# Patient Record
Sex: Female | Born: 1935 | Race: White | Hispanic: No | Marital: Single | Smoking: Never smoker
Health system: Southern US, Community
[De-identification: ages and names within clinical notes are randomized; demographics above are authoritative.]

## PROBLEM LIST (undated history)

## (undated) DIAGNOSIS — F419 Anxiety disorder, unspecified: Secondary | ICD-10-CM

## (undated) DIAGNOSIS — N281 Cyst of kidney, acquired: Secondary | ICD-10-CM

## (undated) DIAGNOSIS — D649 Anemia, unspecified: Secondary | ICD-10-CM

## (undated) DIAGNOSIS — I639 Cerebral infarction, unspecified: Secondary | ICD-10-CM

## (undated) DIAGNOSIS — I10 Essential (primary) hypertension: Secondary | ICD-10-CM

## (undated) DIAGNOSIS — G20 Parkinson's disease: Secondary | ICD-10-CM

## (undated) DIAGNOSIS — R251 Tremor, unspecified: Secondary | ICD-10-CM

## (undated) DIAGNOSIS — R32 Unspecified urinary incontinence: Secondary | ICD-10-CM

## (undated) DIAGNOSIS — N3010 Interstitial cystitis (chronic) without hematuria: Secondary | ICD-10-CM

## (undated) DIAGNOSIS — K219 Gastro-esophageal reflux disease without esophagitis: Secondary | ICD-10-CM

## (undated) DIAGNOSIS — G20A1 Parkinson's disease without dyskinesia, without mention of fluctuations: Secondary | ICD-10-CM

## (undated) DIAGNOSIS — F329 Major depressive disorder, single episode, unspecified: Secondary | ICD-10-CM

## (undated) DIAGNOSIS — M869 Osteomyelitis, unspecified: Secondary | ICD-10-CM

## (undated) DIAGNOSIS — M1990 Unspecified osteoarthritis, unspecified site: Secondary | ICD-10-CM

## (undated) DIAGNOSIS — E538 Deficiency of other specified B group vitamins: Secondary | ICD-10-CM

## (undated) DIAGNOSIS — F32A Depression, unspecified: Secondary | ICD-10-CM

## (undated) HISTORY — DX: Parkinson's disease without dyskinesia, without mention of fluctuations: G20.A1

## (undated) HISTORY — DX: Tremor, unspecified: R25.1

## (undated) HISTORY — DX: Major depressive disorder, single episode, unspecified: F32.9

## (undated) HISTORY — DX: Unspecified urinary incontinence: R32

## (undated) HISTORY — DX: Anemia, unspecified: D64.9

## (undated) HISTORY — PX: TOTAL KNEE ARTHROPLASTY: SHX125

## (undated) HISTORY — DX: Deficiency of other specified B group vitamins: E53.8

## (undated) HISTORY — PX: TOTAL VAGINAL HYSTERECTOMY: SHX2548

## (undated) HISTORY — DX: Interstitial cystitis (chronic) without hematuria: N30.10

## (undated) HISTORY — DX: Anxiety disorder, unspecified: F41.9

## (undated) HISTORY — DX: Cyst of kidney, acquired: N28.1

## (undated) HISTORY — DX: Parkinson's disease: G20

## (undated) HISTORY — DX: Essential (primary) hypertension: I10

## (undated) HISTORY — DX: Osteomyelitis, unspecified: M86.9

## (undated) HISTORY — DX: Unspecified osteoarthritis, unspecified site: M19.90

## (undated) HISTORY — PX: LEG SURGERY: SHX1003

## (undated) HISTORY — DX: Cerebral infarction, unspecified: I63.9

## (undated) HISTORY — DX: Depression, unspecified: F32.A

## (undated) HISTORY — DX: Gastro-esophageal reflux disease without esophagitis: K21.9

## (undated) HISTORY — PX: TOTAL HIP ARTHROPLASTY: SHX124

---

## 2002-09-09 ENCOUNTER — Ambulatory Visit (HOSPITAL_COMMUNITY): Admission: RE | Admit: 2002-09-09 | Discharge: 2002-09-09 | Payer: Self-pay | Admitting: Gastroenterology

## 2004-07-30 ENCOUNTER — Emergency Department (HOSPITAL_COMMUNITY): Admission: EM | Admit: 2004-07-30 | Discharge: 2004-07-30 | Payer: Self-pay | Admitting: Emergency Medicine

## 2005-04-12 ENCOUNTER — Encounter: Admission: RE | Admit: 2005-04-12 | Discharge: 2005-04-12 | Payer: Self-pay | Admitting: Orthopedic Surgery

## 2005-05-16 ENCOUNTER — Encounter: Admission: RE | Admit: 2005-05-16 | Discharge: 2005-05-16 | Payer: Self-pay | Admitting: Orthopedic Surgery

## 2005-10-11 ENCOUNTER — Encounter: Admission: RE | Admit: 2005-10-11 | Discharge: 2005-10-11 | Payer: Self-pay | Admitting: Family Medicine

## 2008-01-02 ENCOUNTER — Encounter: Admission: RE | Admit: 2008-01-02 | Discharge: 2008-01-02 | Payer: Self-pay | Admitting: Gastroenterology

## 2008-04-09 ENCOUNTER — Encounter: Admission: RE | Admit: 2008-04-09 | Discharge: 2008-04-09 | Payer: Self-pay | Admitting: Family Medicine

## 2008-05-09 DIAGNOSIS — G20A1 Parkinson's disease without dyskinesia, without mention of fluctuations: Secondary | ICD-10-CM | POA: Insufficient documentation

## 2009-09-22 ENCOUNTER — Ambulatory Visit (HOSPITAL_COMMUNITY): Admission: RE | Admit: 2009-09-22 | Discharge: 2009-09-22 | Payer: Self-pay | Admitting: Orthopedic Surgery

## 2010-03-05 ENCOUNTER — Encounter: Payer: Self-pay | Admitting: Orthopedic Surgery

## 2010-03-05 ENCOUNTER — Encounter: Payer: Self-pay | Admitting: Diagnostic Neuroimaging

## 2010-03-21 ENCOUNTER — Other Ambulatory Visit: Payer: Self-pay | Admitting: Diagnostic Neuroimaging

## 2010-03-21 DIAGNOSIS — R259 Unspecified abnormal involuntary movements: Secondary | ICD-10-CM

## 2010-03-21 DIAGNOSIS — G20 Parkinson's disease: Secondary | ICD-10-CM

## 2010-03-21 DIAGNOSIS — R269 Unspecified abnormalities of gait and mobility: Secondary | ICD-10-CM

## 2010-03-24 ENCOUNTER — Ambulatory Visit
Admission: RE | Admit: 2010-03-24 | Discharge: 2010-03-24 | Disposition: A | Discharge status: Home / Self Care | Payer: MEDICARE | Source: Ambulatory Visit | Attending: Diagnostic Neuroimaging | Admitting: Diagnostic Neuroimaging

## 2010-03-24 DIAGNOSIS — G20 Parkinson's disease: Secondary | ICD-10-CM

## 2010-03-24 DIAGNOSIS — R259 Unspecified abnormal involuntary movements: Secondary | ICD-10-CM

## 2010-03-24 DIAGNOSIS — R269 Unspecified abnormalities of gait and mobility: Secondary | ICD-10-CM

## 2010-04-13 ENCOUNTER — Ambulatory Visit: Payer: MEDICARE | Attending: Diagnostic Neuroimaging | Admitting: Physical Therapy

## 2010-04-13 DIAGNOSIS — R269 Unspecified abnormalities of gait and mobility: Secondary | ICD-10-CM | POA: Insufficient documentation

## 2010-04-13 DIAGNOSIS — IMO0001 Reserved for inherently not codable concepts without codable children: Secondary | ICD-10-CM | POA: Insufficient documentation

## 2010-04-13 DIAGNOSIS — G20A1 Parkinson's disease without dyskinesia, without mention of fluctuations: Secondary | ICD-10-CM | POA: Insufficient documentation

## 2010-04-13 DIAGNOSIS — G20 Parkinson's disease: Secondary | ICD-10-CM | POA: Insufficient documentation

## 2010-04-17 ENCOUNTER — Ambulatory Visit: Payer: MEDICARE | Admitting: Physical Therapy

## 2010-04-20 ENCOUNTER — Ambulatory Visit: Payer: MEDICARE | Admitting: Physical Therapy

## 2010-04-25 ENCOUNTER — Ambulatory Visit: Payer: MEDICARE | Admitting: Physical Therapy

## 2010-04-27 ENCOUNTER — Ambulatory Visit: Payer: MEDICARE | Admitting: Physical Therapy

## 2010-05-10 ENCOUNTER — Ambulatory Visit: Payer: MEDICARE | Admitting: Physical Therapy

## 2010-05-12 ENCOUNTER — Ambulatory Visit: Payer: MEDICARE | Admitting: Physical Therapy

## 2010-05-15 ENCOUNTER — Encounter: Payer: MEDICARE | Admitting: Occupational Therapy

## 2010-05-16 ENCOUNTER — Ambulatory Visit: Payer: Medicare Other | Attending: Diagnostic Neuroimaging | Admitting: Physical Therapy

## 2010-05-16 DIAGNOSIS — G20 Parkinson's disease: Secondary | ICD-10-CM | POA: Insufficient documentation

## 2010-05-16 DIAGNOSIS — IMO0001 Reserved for inherently not codable concepts without codable children: Secondary | ICD-10-CM | POA: Insufficient documentation

## 2010-05-16 DIAGNOSIS — R269 Unspecified abnormalities of gait and mobility: Secondary | ICD-10-CM | POA: Insufficient documentation

## 2010-05-16 DIAGNOSIS — G20A1 Parkinson's disease without dyskinesia, without mention of fluctuations: Secondary | ICD-10-CM | POA: Insufficient documentation

## 2010-05-18 ENCOUNTER — Encounter: Payer: MEDICARE | Admitting: Occupational Therapy

## 2010-05-24 ENCOUNTER — Ambulatory Visit: Payer: Medicare Other | Admitting: Occupational Therapy

## 2010-06-30 NOTE — Op Note (Signed)
   NAMELACONYA, CLERE                        ACCOUNT NO.:  [id number]   MEDICAL RECORD NO.:  [id number]                   PATIENT TYPE:  AMB   LOCATION:  ENDO                                 FACILITY:  [facility]   PHYSICIAN:  Petra Kuba, M.D.                 DATE OF BIRTH:  Mar 20, 1929   DATE OF PROCEDURE:  09/09/2002  DATE OF DISCHARGE:                                 OPERATIVE REPORT   PROCEDURE PERFORMED:  Colonoscopy.   ENDOSCOPIST:  Petra Kuba, M.D.   INDICATIONS FOR PROCEDURE:  Screening.  Consent was signed after the risks,  benefits, methods and options were thoroughly discussed in the office and  before any premeds given.   MEDICINES USED:  Demerol 70 mg, Versed 7 mg.   DESCRIPTION OF PROCEDURE:  Rectal inspection was pertinent for external  hemorrhoids, small.  Digital exam was negative.  A video pediatric  adjustable colonoscope was inserted and with mild abdominal pressure  advanced around the colon to the cecum.  This did not require any position  changes.  There was no obvious abnormality.  Left-sided diverticula were  seen on insertion.  The cecum was identified by the appendiceal orifice and  the ileocecal valve.  The prep was fairly adequate.  With washing and  suctioning, adequate visualization was obtained, the scope was slowly  withdrawn.  Other than the left-sided diverticula scattered as mentioned  above, no abnormalities were seen.  Specifically no polyps, tumors, masses.  Anorectal pullthrough and retroflexion confirmed some small hemorrhoids in  the rectum.  The scope was reinserted a short ways up the left side of the  colon.  Air was suctioned, scope removed.  The patient tolerated the  procedure well.  There was no immediate obvious complication.   ENDOSCOPIC DIAGNOSIS:  1. Internal and external hemorrhoids, small.  2. Scattered diverticula.  3. Otherwise within normal limits to the cecum.   PLAN:  Happy to see back p.r.n.  Otherwise return  care to Dr. Lenise Arena for  customary health care maintenance to include yearly rectals and guaiacs and  consider repeat screening in 5 to 10 years if doing well medically.                                               Petra Kuba, M.D.    MEM/MEDQ  D:  09/09/2002  T:  09/09/2002  Job:  [id number]   cc:   Joycelyn Rua, M.D.  [address] [place]  [place] [address]  Fax: [phone]

## 2011-04-19 ENCOUNTER — Other Ambulatory Visit (HOSPITAL_COMMUNITY)
Admission: RE | Admit: 2011-04-19 | Discharge: 2011-04-19 | Disposition: A | Discharge status: Home / Self Care | Payer: Medicare Other | Source: Ambulatory Visit | Attending: Obstetrics and Gynecology | Admitting: Obstetrics and Gynecology

## 2011-04-19 DIAGNOSIS — Z124 Encounter for screening for malignant neoplasm of cervix: Secondary | ICD-10-CM | POA: Insufficient documentation

## 2011-10-19 ENCOUNTER — Telehealth: Payer: Self-pay | Admitting: Oncology

## 2011-10-19 NOTE — Telephone Encounter (Signed)
pt l/m that she would like an appt re: a knot on her hand,l/m for Dorene Grebe to call the pt  aom

## 2012-07-08 ENCOUNTER — Ambulatory Visit: Payer: Self-pay | Admitting: Diagnostic Neuroimaging

## 2012-07-09 ENCOUNTER — Ambulatory Visit (INDEPENDENT_AMBULATORY_CARE_PROVIDER_SITE_OTHER): Payer: Medicare Other | Admitting: Diagnostic Neuroimaging

## 2012-07-09 ENCOUNTER — Encounter: Payer: Self-pay | Admitting: Diagnostic Neuroimaging

## 2012-07-09 VITALS — BP 128/79 | HR 85 | Temp 98.4°F | Ht 64.0 in | Wt 139.0 lb

## 2012-07-09 DIAGNOSIS — R259 Unspecified abnormal involuntary movements: Secondary | ICD-10-CM

## 2012-07-09 DIAGNOSIS — R251 Tremor, unspecified: Secondary | ICD-10-CM

## 2012-07-09 DIAGNOSIS — R269 Unspecified abnormalities of gait and mobility: Secondary | ICD-10-CM | POA: Insufficient documentation

## 2012-07-09 DIAGNOSIS — G20 Parkinson's disease: Secondary | ICD-10-CM

## 2012-07-09 DIAGNOSIS — G20A1 Parkinson's disease without dyskinesia, without mention of fluctuations: Secondary | ICD-10-CM

## 2012-07-09 MED ORDER — CARBIDOPA-LEVODOPA 25-100 MG PO TABS: 1.0000 | ORAL_TABLET | Freq: Three times a day (TID) | ORAL | Status: DC

## 2012-07-09 NOTE — Progress Notes (Signed)
[facility]  PATIENT: Nina Wade DOB: March 10, 1929  REFERRING CLINICIAN:  HISTORY FROM: PATIENT REASON FOR VISIT: routine follow up   HISTORICAL  CHIEF COMPLAINT:  Chief Complaint  Patient presents with  . Follow-up    PD    HISTORY OF PRESENT ILLNESS:   UPDATE 07/09/12: 79 year old left-handed female, has been 6 months since last visit.  She states that she feels like she is doing the same.  No falls since her last visit.  She is taking the carb/levo three times a day at 8, 12, and in the evening, but tells me she doesn't feel any different when she takes it or can't tell that it makes a difference.  She is experiencing constipation, and takes Miralax prn and estimates her BMs to be every other day.  Her main complaint is fatigue.  She says she is walking ok without the cane or walker.  She does not think her tremors are any worse.  UPDATE 11/19/11: Patient forgot to follow up. Has been 1.5 years since last visit/ [place], she tells me that she has been taking only 1 tab of carb/levo per day (says this was written on the bottle). Many more falls lately. Using a 4 point cane; got a rolling walker with brakes last friday. Has tried PT, but now discharged.  UPDATE 04/19/10: Doing about the same.  Think her tremor slightly improved since starting carb/levo.  Has been to PT and doing well.  No falls.  No side effects.    PRIOR HPI: 79 year old left-handed female with history of hypertension and depression presenting for evaluation of tremor since March 2010.  Patient reports progressive right upper extremity tremor.  She was initially evaluated [facility] by Dr. Conrad Vincennes, who raised the possibility of Parkinson's disease but did not start treatment since her tremor was mild and improving.  Now tremor is worse.  No tremor in her left hand. She denies smell or taste problems, vivid dreams, but does have balance and walking problems.  She recently fell down when power  went out and she tripped in the dark.  No family history of Parkinson's disease. The patient thinks that her symptoms started after her multiple orthopedic surgeries which were complicated by infection.  REVIEW OF SYSTEMS: Full 14 system review of systems performed and notable only for weight loss fatigue incontinence urination from joint pain decreased energy.  ALLERGIES: No Known Allergies  HOME MEDICATIONS: Outpatient Encounter Prescriptions as of 07/09/2012  Medication Sig Dispense Refill  . aspirin 325 MG tablet Take 325 mg by mouth daily.      . carbidopa-levodopa (SINEMET IR) 25-100 MG per tablet Take 1 tablet by mouth 3 (three) times daily. Take 30 minutes before meals.  90 tablet  12  . ciprofloxacin (CIPRO) 500 MG tablet Take 500 mg by mouth 2 (two) times daily.      Marland Kitchen co-enzyme Q-10 50 MG capsule Take 50 mg by mouth daily.      Marland Kitchen FLUoxetine (PROZAC) 20 MG capsule Take 20 mg by mouth 2 (two) times daily.      . Multiple Vitamins-Minerals (MULTIVITAMIN WITH MINERALS) tablet Take 1 tablet by mouth daily.      Marland Kitchen oxybutynin (DITROPAN-XL) 5 MG 24 hr tablet Take 5 mg by mouth daily.      . polyethylene glycol powder (GLYCOLAX/MIRALAX) powder Take 17 g by mouth daily as needed.      . valsartan-hydrochlorothiazide (DIOVAN-HCT) 160-12.5 MG per tablet Take 1 tablet by mouth  daily.      . vitamin B-12 (CYANOCOBALAMIN) 100 MCG tablet Take 50 mcg by mouth daily.      . [DISCONTINUED] carbidopa-levodopa (SINEMET IR) 25-100 MG per tablet Take 1 tablet by mouth 3 (three) times daily.        No facility-administered encounter medications on file as of 07/09/2012.    PAST MEDICAL HISTORY: Past Medical History  Diagnosis Date  . Arthritis   . Hypertension   . Anxiety   . Interstitial cystitis   . B12 deficiency   . Tremor   . CVA (cerebral infarction)   . Osteomyelitis   . Renal cyst   . Incontinence   . Esophageal reflux   . Anemia   . Depressive disorder   . Parkinson disease      PAST SURGICAL HISTORY: Past Surgical History  Procedure Laterality Date  . Total hip arthroplasty    . Total knee arthroplasty    . Leg surgery Right     x6  . Total vaginal hysterectomy      FAMILY HISTORY: Family History  Problem Relation Age of Onset  . Cancer Father   . Heart Problems Mother   . Cancer Brother     SOCIAL HISTORY:  History   Social History  . Marital Status: Single    Spouse Name: N/A    Number of Children: 3  . Years of Education: HS   Occupational History  . Retired    Social History Main Topics  . Smoking status: Never Smoker   . Smokeless tobacco: Never Used  . Alcohol Use: No  . Drug Use: No  . Sexually Active: Not on file   Other Topics Concern  . Not on file   Social History Narrative   Pt lives at home with her spouse.   She has been married for 58+ years.   Caffeine Use: Does not consume     PHYSICAL EXAM  Filed Vitals:   07/09/12 1015  BP: 128/79  Pulse: 85  Temp: 98.4 F (36.9 C)  TempSrc: Oral  Height: 5\' 4"  (1.626 m)  Weight: 139 lb (63.05 kg)    Not recorded    Body mass index is 23.85 kg/(m^2).  EXAM: General: Patient is awake, alert and in no acute distress.  Well developed and groomed. Neck: Neck is supple. Cardiovascular: No carotid artery bruits.  Heart is regular rate and rhythm with no murmurs.  Neurologic Exam  Mental Status: Awake, alert.  Language is fluent and comprehension intact.  POSITIVE MYERSON'S SIGN.  POSITIVE SNOUT REFLEX. Cranial Nerves: Pupils are equal and reactive to light.  Visual fields are full to confrontation.  Conjugate eye movements are full and symmetric.  Facial sensation and strength are symmetric.  Hearing is intact.  Palate elevated symmetrically and uvula is midline.  Shoulder shug is symmetric.  Tongue is midline. Motor: Normal bulk and COGWHEEL RIGIDITY IN RUE > LUE.  REST TREMOR IN RUE, WITH MILD INTENTION TREMOR.  NO POSTURAL TREMOR.  MARKED BRADYKINESIA IN BUE AND  BLE (RIGHT WORSE THAN LEFT).  Full strength in the upper and lower extremities.  No pronator drift. Sensory: Intact and symmetric to light touch, pinprick, temperature, vibration. Coordination: MILD DYSMETRIA IN RUE. Gait and Station: Narrow based gait; STOOPED POSTURE.  POOR ARM SWING; RUE TREMOR. VERY UNSTEADY. USING A CANE. Reflexes: Deep tendon reflexes in the UPPER EXT ARE TRACE; ABSENT IN LEGS.   DIAGNOSTIC DATA (LABS, IMAGING, TESTING) - I reviewed patient records, labs,  notes, testing and imaging myself where available.  No results found for this basename: WBC, HGB, HCT, MCV, PLT   No results found for this basename: na, k, cl, co2, glucose, bun, creatinine, calcium, prot, albumin, ast, alt, alkphos, bilitot, gfrnonaa, gfraa   No results found for this basename: CHOL, HDL, LDLCALC, LDLDIRECT, TRIG, CHOLHDL   No results found for this basename: HGBA1C   No results found for this basename: VITAMINB12   No results found for this basename: TSH      ASSESSMENT AND PLAN  79 y.o.  female with hypertension, depression, with progressive tremor and gait difficulty since March 2010.  Exam notable for right arm resting tremor, cogwheel rigidity, bradykinesia and postural instability.  Has 4/4 cardinal signs of Parkinson's disease.  Mild response to carb/levo.  PLAN: 1. continue carbidopa/levodopa 1 tab TID 2. urge use of cane or walker to prevent falls 3. follow up with Heide Guile in 6 months    Suanne Marker, MD and Heide Guile, NP, 07/09/2012 Certified in Neurology, Neurophysiology and Neuroimaging  [facility] [address], [address] [place], [place] [address] [phone]

## 2012-07-09 NOTE — Patient Instructions (Addendum)
Continue taking Carbodopa/Levodopa as directed. Follow up in office in 6 months.    Parkinson's Disease Parkinson's disease is a disorder of the central nervous system, which includes the brain and spinal cord. A person with this disease slowly loses the ability to completely control body movements. Within the brain, there is a group of nerve cells (basal ganglia) that help control movement. The basal ganglia are damaged and do not work properly in a person with Parkinson's disease. In addition, the basal ganglia produce and use a brain chemical called dopamine. The dopamine chemical sends messages to other parts of the body to control and coordinate body movements. Dopamine levels are low in a person with Parkinson's disease. If the dopamine levels are low, then the body does not receive the correct messages it needs to move normally.  CAUSES  The exact reason why the basal ganglia get damaged is not known. Some medical researchers have thought that infection, genes, environment, and certain medicines may contribute to the cause.  SYMPTOMS   An early symptom of Parkinson's disease is often an uncontrolled shaking (tremor) of the hands. The tremor will often disappear when the affected hand is consciously used.  As the disease progresses, walking, talking, getting out of a chair, and new movements become more difficult.  Muscles get stiff and movements become slower.  Balance and coordination become harder.  Depression, trouble swallowing, urinary problems, constipation, and sleep problems can occur.  Later in the disease, memory and thought processes may deteriorate. DIAGNOSIS  There are no specific tests to diagnose Parkinson's disease. You may be referred to a neurologist for evaluation. Your caregiver will ask about your medical history, symptoms, and perform a physical exam. Blood tests and imaging tests of your brain may be performed to rule out other diseases. The imaging tests may  include an MRI or a CT scan. TREATMENT  The goal of treatment is to relieve symptoms. Medicines may be prescribed once the symptoms become troublesome. Medicine will not stop the progression of the disease, but medicine can make movement and balance better and help control tremors. Speech and occupational therapy may also be prescribed. Sometimes, surgical treatment of the brain can be done in young people. HOME CARE INSTRUCTIONS  Get regular exercise and rest periods during the day to help prevent exhaustion and depression.  If getting dressed becomes difficult, replace buttons and zippers with Velcro and elastic on your clothing.  Take all medicine as directed by your caregiver.  Install grab bars or railings in your home to prevent falls.  Go to speech or occupational therapy as directed.  Keep all follow-up visits as directed by your caregiver. SEEK MEDICAL CARE IF:  Your symptoms are not controlled with your medicine.  You fall.  You have trouble swallowing or choke on your food. MAKE SURE YOU:  Understand these instructions.  Will watch your condition.  Will get help right away if you are not doing well or get worse. Document Released: 01/27/2000 Document Revised: 07/31/2011 Document Reviewed: 02/28/2011 [facility] Patient Information 2014 [place], [place].

## 2012-10-17 ENCOUNTER — Encounter (HOSPITAL_COMMUNITY): Payer: Self-pay | Admitting: Emergency Medicine

## 2012-10-17 ENCOUNTER — Emergency Department (HOSPITAL_COMMUNITY): Payer: Medicare Other

## 2012-10-17 ENCOUNTER — Inpatient Hospital Stay (HOSPITAL_COMMUNITY)
Admission: EM | Admit: 2012-10-17 | Discharge: 2012-10-19 | DRG: 690 | Disposition: A | Discharge status: Home / Self Care | Payer: Medicare Other | Attending: Internal Medicine | Admitting: Internal Medicine

## 2012-10-17 DIAGNOSIS — Z96659 Presence of unspecified artificial knee joint: Secondary | ICD-10-CM

## 2012-10-17 DIAGNOSIS — G20 Parkinson's disease: Secondary | ICD-10-CM

## 2012-10-17 DIAGNOSIS — E861 Hypovolemia: Secondary | ICD-10-CM | POA: Diagnosis present

## 2012-10-17 DIAGNOSIS — R031 Nonspecific low blood-pressure reading: Secondary | ICD-10-CM | POA: Diagnosis present

## 2012-10-17 DIAGNOSIS — N179 Acute kidney failure, unspecified: Secondary | ICD-10-CM

## 2012-10-17 DIAGNOSIS — Z79899 Other long term (current) drug therapy: Secondary | ICD-10-CM

## 2012-10-17 DIAGNOSIS — Z96649 Presence of unspecified artificial hip joint: Secondary | ICD-10-CM

## 2012-10-17 DIAGNOSIS — R079 Chest pain, unspecified: Secondary | ICD-10-CM

## 2012-10-17 DIAGNOSIS — I959 Hypotension, unspecified: Secondary | ICD-10-CM

## 2012-10-17 DIAGNOSIS — I10 Essential (primary) hypertension: Secondary | ICD-10-CM | POA: Diagnosis present

## 2012-10-17 DIAGNOSIS — G20A1 Parkinson's disease without dyskinesia, without mention of fluctuations: Secondary | ICD-10-CM

## 2012-10-17 DIAGNOSIS — N390 Urinary tract infection, site not specified: Principal | ICD-10-CM

## 2012-10-17 DIAGNOSIS — Z8673 Personal history of transient ischemic attack (TIA), and cerebral infarction without residual deficits: Secondary | ICD-10-CM

## 2012-10-17 DIAGNOSIS — Z66 Do not resuscitate: Secondary | ICD-10-CM | POA: Diagnosis present

## 2012-10-17 DIAGNOSIS — K219 Gastro-esophageal reflux disease without esophagitis: Secondary | ICD-10-CM | POA: Diagnosis present

## 2012-10-17 DIAGNOSIS — E871 Hypo-osmolality and hyponatremia: Secondary | ICD-10-CM

## 2012-10-17 LAB — BASIC METABOLIC PANEL
BUN: 23 mg/dL (ref 6–23)
Chloride: 96 mEq/L (ref 96–112)
GFR calc Af Amer: 40 mL/min — ABNORMAL LOW (ref >90)
GFR calc non Af Amer: 35 mL/min — ABNORMAL LOW (ref >90)
Glucose, Bld: 108 mg/dL — ABNORMAL HIGH (ref 70–99)
Potassium: 3.7 mEq/L (ref 3.5–5.1)
Sodium: 133 mEq/L — ABNORMAL LOW (ref 135–145)

## 2012-10-17 LAB — CBC
HCT: 38 % (ref 36.0–46.0)
Hemoglobin: 12.7 g/dL (ref 12.0–15.0)
MCH: 29 pg (ref 26.0–34.0)
MCV: 86.8 fL (ref 78.0–100.0)
Platelets: 199 10*3/uL (ref 150–400)
WBC: 6.4 10*3/uL (ref 4.0–10.5)

## 2012-10-17 LAB — URINALYSIS, ROUTINE W REFLEX MICROSCOPIC
Bilirubin Urine: NEGATIVE
Glucose, UA: NEGATIVE mg/dL
Nitrite: NEGATIVE
Specific Gravity, Urine: 1.015 (ref 1.005–1.030)
Urobilinogen, UA: 1 mg/dL (ref 0.0–1.0)
pH: 6 (ref 5.0–8.0)

## 2012-10-17 LAB — URINE MICROSCOPIC-ADD ON

## 2012-10-17 LAB — TROPONIN I: Troponin I: <0.3 ng/mL (ref <0.30)

## 2012-10-17 LAB — CG4 I-STAT (LACTIC ACID): Lactic Acid, Venous: 0.9 mmol/L (ref 0.5–2.2)

## 2012-10-17 MED ORDER — DEXTROSE 5 % IV SOLN
1.0000 g | INTRAVENOUS | Status: DC
  Administered 2012-10-17: 1 g via INTRAVENOUS
  Filled 2012-10-17: qty 10

## 2012-10-17 MED ORDER — NITROGLYCERIN 0.4 MG SL SUBL: 0.4000 mg | SUBLINGUAL_TABLET | SUBLINGUAL | Status: DC | PRN

## 2012-10-17 MED ORDER — ONDANSETRON HCL 4 MG PO TABS: 4.0000 mg | ORAL_TABLET | Freq: Four times a day (QID) | ORAL | Status: DC | PRN

## 2012-10-17 MED ORDER — ENOXAPARIN SODIUM 30 MG/0.3ML ~~LOC~~ SOLN
30.0000 mg | SUBCUTANEOUS | Status: DC
  Administered 2012-10-17: 21:00:00 30 mg via SUBCUTANEOUS
  Filled 2012-10-17 (×2): qty 0.3

## 2012-10-17 MED ORDER — FLUOXETINE HCL 20 MG PO CAPS
20.0000 mg | ORAL_CAPSULE | Freq: Every day | ORAL | Status: DC
  Administered 2012-10-18 – 2012-10-19 (×2): 20 mg via ORAL
  Filled 2012-10-17 (×2): qty 1

## 2012-10-17 MED ORDER — ONDANSETRON HCL 4 MG/2ML IJ SOLN: 4.0000 mg | Freq: Four times a day (QID) | INTRAMUSCULAR | Status: DC | PRN

## 2012-10-17 MED ORDER — ASPIRIN 325 MG PO TABS
325.0000 mg | ORAL_TABLET | Freq: Every day | ORAL | Status: DC
  Administered 2012-10-17 – 2012-10-19 (×3): 325 mg via ORAL
  Filled 2012-10-17 (×3): qty 1

## 2012-10-17 MED ORDER — PANTOPRAZOLE SODIUM 40 MG PO TBEC: 40.0000 mg | DELAYED_RELEASE_TABLET | Freq: Two times a day (BID) | ORAL | Status: DC

## 2012-10-17 MED ORDER — MORPHINE SULFATE 2 MG/ML IJ SOLN: 0.5000 mg | INTRAMUSCULAR | Status: DC | PRN

## 2012-10-17 MED ORDER — OXYBUTYNIN CHLORIDE ER 10 MG PO TB24
10.0000 mg | ORAL_TABLET | Freq: Every day | ORAL | Status: DC
  Administered 2012-10-18 – 2012-10-19 (×2): 10 mg via ORAL
  Filled 2012-10-17 (×2): qty 1

## 2012-10-17 MED ORDER — POLYETHYLENE GLYCOL 3350 17 G PO PACK
17.0000 g | PACK | Freq: Every day | ORAL | Status: DC | PRN
  Filled 2012-10-17: qty 1

## 2012-10-17 MED ORDER — CARBIDOPA-LEVODOPA 25-100 MG PO TABS
1.0000 | ORAL_TABLET | Freq: Three times a day (TID) | ORAL | Status: DC
  Administered 2012-10-17 – 2012-10-19 (×5): 1 via ORAL
  Filled 2012-10-17 (×7): qty 1

## 2012-10-17 MED ORDER — DEXTROSE 5 % IV SOLN
1.0000 g | Freq: Every day | INTRAVENOUS | Status: DC
  Administered 2012-10-18 – 2012-10-19 (×2): 1 g via INTRAVENOUS
  Filled 2012-10-17 (×2): qty 10

## 2012-10-17 MED ORDER — FAMOTIDINE 20 MG PO TABS
20.0000 mg | ORAL_TABLET | Freq: Two times a day (BID) | ORAL | Status: DC
  Administered 2012-10-17 – 2012-10-19 (×4): 20 mg via ORAL
  Filled 2012-10-17 (×6): qty 1

## 2012-10-17 MED ORDER — SODIUM CHLORIDE 0.9 % IV SOLN
INTRAVENOUS | Status: DC
  Administered 2012-10-17: 14:00:00 via INTRAVENOUS

## 2012-10-17 MED ORDER — SODIUM CHLORIDE 0.9 % IJ SOLN: 3.0000 mL | Freq: Two times a day (BID) | INTRAMUSCULAR | Status: DC

## 2012-10-17 MED ORDER — SODIUM CHLORIDE 0.9 % IV SOLN
INTRAVENOUS | Status: DC
  Administered 2012-10-18: 12:00:00 100 mL/h via INTRAVENOUS
  Administered 2012-10-18 – 2012-10-19 (×3): via INTRAVENOUS

## 2012-10-17 MED ORDER — ACETAMINOPHEN 325 MG PO TABS: 650.0000 mg | ORAL_TABLET | Freq: Four times a day (QID) | ORAL | Status: DC | PRN

## 2012-10-17 MED ORDER — OXYCODONE HCL 5 MG PO TABS: 5.0000 mg | ORAL_TABLET | Freq: Four times a day (QID) | ORAL | Status: DC | PRN

## 2012-10-17 MED ORDER — POLYETHYLENE GLYCOL 3350 17 GM/SCOOP PO POWD
17.0000 g | Freq: Every day | ORAL | Status: DC | PRN
  Filled 2012-10-17: qty 255

## 2012-10-17 MED ORDER — ACETAMINOPHEN 650 MG RE SUPP: 650.0000 mg | Freq: Four times a day (QID) | RECTAL | Status: DC | PRN

## 2012-10-17 NOTE — Progress Notes (Signed)
Patient declines MyChart activation-no computer 

## 2012-10-17 NOTE — Progress Notes (Signed)
Discussed admission status with Dr. Suanne Marker.

## 2012-10-17 NOTE — ED Notes (Signed)
Pt CBG is 101

## 2012-10-17 NOTE — ED Notes (Signed)
Per EMS: Pt was at PCP with chief complaint of generalized joint pain, weakness, and requesting arthritis medication. PCP called EMS due to hypotension of lying 88/50 (p-69), sitting 78/50 (p-79), standing - unable to complete due to dizziness. Pt also reports right sided chest pain that began on Sunday, which was also the onset of her generalized weakness. EMS reports normotensive blood pressures while in route.

## 2012-10-17 NOTE — H&P (Signed)
[facility] History and Physical  Nina Wade AOZ:[id number] DOB: 08-13-1929 DOA: 10/17/2012  Referring physician: Dr. Freida Busman PCP: Gaye Alken, MD  Specialists: none  Chief Complaint: Weakness and right-sided chest pain  HPI: Nina Wade is a 79 y.o. female history of hypertension, CVA, esophageal reflux and other past medical history as listed below who presents with above complaints. She states that the the past week she's had progressive weakness in right-sided chest pain-below her right breast. She describes the chest pain as intermittent sharp worsened to 8/10 intensity today and so she came to the ED. She denies associated nausea vomiting diaphoresis and no shortness of breath. She states that she's had decreased by mouth intake for a good while. As ready mentioned she denies nausea vomiting and no dysphagia. She states that she has a lot on her taking care of her son with disabilities and also her husband was dementia. She denies fevers, dysuria, cough, diarrhea melena and no hematochezia. She was seen in the ED and a chest x-ray showed no acute cardiopulmonary process, d-dimer was within normal limits, troponin negative and urinalysis was consistent with a UTI. She is admitted for further evaluation and management. It is reported that patient had initially gone to her PCP - complained of dizziness and her blood pressure sitting was in the 70s systolic>> EMS was called and they state that upon their arrival blood pressure was within normal limits and in the ED a pressure was stable as well.   Review of Systems: The patient denies, fever, weight loss,, vision loss, decreased hearing, hoarseness, chest pain, syncope, dyspnea on exertion, peripheral edema, hemoptysis, abdominal pain, melena, hematochezia, severe indigestion/heartburn, hematuria, incontinence, suspicious skin lesions, transient blindness   Past Medical History  Diagnosis Date  .  Arthritis   . Hypertension   . Anxiety   . Interstitial cystitis   . B12 deficiency   . Tremor   . CVA (cerebral infarction)   . Osteomyelitis   . Renal cyst   . Incontinence   . Esophageal reflux   . Anemia   . Depressive disorder   . Parkinson disease    Past Surgical History  Procedure Laterality Date  . Total hip arthroplasty    . Total knee arthroplasty    . Leg surgery Right     x6  . Total vaginal hysterectomy     Social History:  reports that she has never smoked. She has never used smokeless tobacco. She reports that she does not drink alcohol or use illicit drugs.  where does patient live--home  Can patient participate in ADLs  No Known Allergies  Family History  Problem Relation Age of Onset  . Cancer Father   . Heart Problems Mother   . Cancer Brother     Prior to Admission medications   Medication Sig Start Date End Date Taking? Authorizing Provider  aspirin 325 MG tablet Take 325 mg by mouth daily.   Yes Historical Provider, MD  carbidopa-levodopa (SINEMET IR) 25-100 MG per tablet Take 1 tablet by mouth 3 (three) times daily. Take 30 minutes before meals. 07/09/12  Yes Ronal Fear, NP  FLUoxetine (PROZAC) 20 MG capsule Take 20 mg by mouth daily.    Yes Historical Provider, MD  oxybutynin (DITROPAN-XL) 10 MG 24 hr tablet Take 10 mg by mouth daily.   Yes Historical Provider, MD  polyethylene glycol powder (GLYCOLAX/MIRALAX) powder Take 17 g by mouth daily as needed (for constipation).    Yes Historical Provider,  MD  valsartan-hydrochlorothiazide (DIOVAN-HCT) 160-12.5 MG per tablet Take 1 tablet by mouth daily.   Yes Historical Provider, MD   Physical Exam: Filed Vitals:   10/17/12 1650  BP: 146/105  Pulse: 72  Temp: 98 F (36.7 C)  Resp: 20    Constitutional: Vital signs reviewed.  Patient is a well-developed and well-nourished  in no acute distress and cooperative with exam. Alert and oriented x3.  Head: Normocephalic and atraumatic Nose: No  erythema or drainage noted.  Turbinates normal Mouth: no erythema or exudates, slightly dry MMM Eyes: PERRL, EOMI, conjunctivae normal, No scleral icterus.  Neck: Supple, Trachea midline normal ROM, No JVD, mass, thyromegaly, or carotid bruit present.  Cardiovascular: RRR, S1 normal, S2 normal, no MRG, pulses symmetric and intact bilaterally Pulmonary/Chest: normal respiratory effort, CTAB, no wheezes, rales, or rhonchi Abdominal: Soft. Non-tender, non-distended, bowel sounds are normal, no masses, organomegaly, or guarding present.  GU: no CVA tenderness  Extremities: no cyanosis and no edema, right hand with resting tremor  Neurological: A&O x3, Strength is normal and symmetric bilaterally, cranial nerve II-XII are grossly intact,sensory intact to light touch bilaterally.  right hand resting tremor as above Skin: Warm, dry and intact. No rash.  Psychiatric: Normal mood and affect. speech and behavior is normal. Judgment and thought content normal. Cognition and memory are normal.     Labs on Admission:  Basic Metabolic Panel:  Recent Labs Lab 10/17/12 1330  NA 133*  K 3.7  CL 96  CO2 29  GLUCOSE 108*  BUN 23  CREATININE 1.39*  CALCIUM 10.1   Liver Function Tests: No results found for this basename: AST, ALT, ALKPHOS, BILITOT, PROT, ALBUMIN,  in the last 168 hours No results found for this basename: LIPASE, AMYLASE,  in the last 168 hours No results found for this basename: AMMONIA,  in the last 168 hours CBC:  Recent Labs Lab 10/17/12 1330  WBC 6.4  HGB 12.7  HCT 38.0  MCV 86.8  PLT 199   Cardiac Enzymes: No results found for this basename: CKTOTAL, CKMB, CKMBINDEX, TROPONINI,  in the last 168 hours  BNP (last 3 results) No results found for this basename: PROBNP,  in the last 8760 hours CBG:  Recent Labs Lab 10/17/12 1353  GLUCAP 101*    Radiological Exams on Admission: Dg Chest 2 View  10/17/2012   *RADIOLOGY REPORT*  Clinical Data: Right-sided chest  pain  CHEST - 2 VIEW  Comparison: None  Findings: Mild pulmonary hyperexpansion and central bronchitic changes consistent with underlying COPD.  The cardiac and mediastinal contours within normal limits.  Tortuous and mildly ectatic thoracic aorta.  No acute osseous abnormality.  There is exaggerated thoracic kyphosis which results in the appearance of opacities in the lung bases on the frontal view.  No evidence of focal airspace consolidation, pleural effusion or pneumothorax.  IMPRESSION:  1.  No acute cardiopulmonary process. 2.  Hyperexpansion and bronchitic changes suggest underlying COPD.   Original Report Authenticated By: Malachy Moan, M.D.    EKG: Independently reviewed. No sinus rhythm at 80, with no acute ischemic changes  Assessment/Plan Active Problems:  Chest pain -Atypical, cycle cardiac enzymes, follow and consider cardiology consult pending enzymes -Continue aspirin, when necessary nitroglycerin -She has a history of esophageal reflux, will also placed on H2 blocker and follow UTI (lower urinary tract infection) -Obtain culture, Empiric antibiotics and follow -Lactic acid level within normal limits, and patient afebrile with no leukocytosis   Transient hypotension/orthostatic hypotension -more likely Secondary  to hypovolemia/volume depletion -Check orthostatics, hydrate follow -Cycle cardiac enzymes as above -Treating UTI as above   AKI (acute kidney injury) -Hydrate follow and recheck   Hyponatremia -Secondary to to volume depletion, hydrate follow and   Parkinson's disease (tremor, stiffness, slow motion, unstable posture) -Continue outpatient medications      Code Status: full Family Communication: None at bedside Disposition Plan: to home whenmedically stable  Time spent: >30  Kela Millin [facility] Pager [phone]  If 7PM-7AM, please contact night-coverage [email or web address] Password [facility] 10/17/2012, 5:51 PM

## 2012-10-17 NOTE — ED Provider Notes (Signed)
CSN: [id number]     Arrival date & time 10/17/12  1324 History   First MD Initiated Contact with Patient 10/17/12 1335     Chief Complaint  Patient presents with  . Weakness  . Chest Pain   (Consider location/radiation/quality/duration/timing/severity/associated sxs/prior Treatment) Patient is a 79 y.o. female presenting with weakness and chest pain. The history is provided by the patient.  Weakness Associated symptoms include chest pain.  Chest Pain Associated symptoms: weakness    patient here complaining of 3 days of weakness with some associated intermittent right-sided chest pain. Was at her doctors office today and was complaining of dizziness and patient was orthostatic with a systolic blood pressure of 70 with sitting up. Denies any recent fever or chills. No vomiting or diarrhea. No black or bloody stools. No abdominal pain. No syncope or near-syncope. No leg swelling or tenderness. EMS was called and on their arrival the patient's blood pressure was normal and she was transported here. Patient's chest pain is vague and is not associated with diaphoresis or dizziness. It lasts for approximately minutes to hours. Nothing makes it better or worse. No treatment used for prior to arrival  Past Medical History  Diagnosis Date  . Arthritis   . Hypertension   . Anxiety   . Interstitial cystitis   . B12 deficiency   . Tremor   . CVA (cerebral infarction)   . Osteomyelitis   . Renal cyst   . Incontinence   . Esophageal reflux   . Anemia   . Depressive disorder   . Parkinson disease    Past Surgical History  Procedure Laterality Date  . Total hip arthroplasty    . Total knee arthroplasty    . Leg surgery Right     x6  . Total vaginal hysterectomy     Family History  Problem Relation Age of Onset  . Cancer Father   . Heart Problems Mother   . Cancer Brother    History  Substance Use Topics  . Smoking status: Never Smoker   . Smokeless tobacco: Never Used  . Alcohol  Use: No   OB History   Grav Para Term Preterm Abortions TAB SAB Ect Mult Living                 Review of Systems  Cardiovascular: Positive for chest pain.  Neurological: Positive for weakness.  All other systems reviewed and are negative.    Allergies  Review of patient's allergies indicates no known allergies.  Home Medications   Current Outpatient Rx  Name  Route  Sig  Dispense  Refill  . aspirin 325 MG tablet   Oral   Take 325 mg by mouth daily.         . carbidopa-levodopa (SINEMET IR) 25-100 MG per tablet   Oral   Take 1 tablet by mouth 3 (three) times daily. Take 30 minutes before meals.   90 tablet   12   . ciprofloxacin (CIPRO) 500 MG tablet   Oral   Take 500 mg by mouth 2 (two) times daily.         Marland Kitchen co-enzyme Q-10 50 MG capsule   Oral   Take 50 mg by mouth daily.         Marland Kitchen FLUoxetine (PROZAC) 20 MG capsule   Oral   Take 20 mg by mouth 2 (two) times daily.         . Multiple Vitamins-Minerals (MULTIVITAMIN WITH MINERALS) tablet   Oral  Take 1 tablet by mouth daily.         . polyethylene glycol powder (GLYCOLAX/MIRALAX) powder   Oral   Take 17 g by mouth daily as needed.         . valsartan-hydrochlorothiazide (DIOVAN-HCT) 160-12.5 MG per tablet   Oral   Take 1 tablet by mouth daily.         . vitamin B-12 (CYANOCOBALAMIN) 100 MCG tablet   Oral   Take 50 mcg by mouth daily.          BP 136/89  Pulse 80  Temp(Src) 98.1 F (36.7 C) (Oral)  Resp 16  SpO2 93% Physical Exam  Nursing note and vitals reviewed. Constitutional: She is oriented to person, place, and time. She appears well-developed and well-nourished.  Non-toxic appearance. No distress.  HENT:  Head: Normocephalic and atraumatic.  Eyes: Conjunctivae, EOM and lids are normal. Pupils are equal, round, and reactive to light.  Neck: Normal range of motion. Neck supple. No tracheal deviation present. No mass present.  Cardiovascular: Normal rate, regular rhythm and  normal heart sounds.  Exam reveals no gallop.   No murmur heard. Pulmonary/Chest: Effort normal and breath sounds normal. No stridor. No respiratory distress. She has no decreased breath sounds. She has no wheezes. She has no rhonchi. She has no rales.  Abdominal: Soft. Normal appearance and bowel sounds are normal. She exhibits no distension. There is no tenderness. There is no rebound and no CVA tenderness.  Musculoskeletal: Normal range of motion. She exhibits no edema and no tenderness.  Neurological: She is alert and oriented to person, place, and time. She has normal strength. No cranial nerve deficit or sensory deficit. GCS eye subscore is 4. GCS verbal subscore is 5. GCS motor subscore is 6.  Skin: Skin is warm and dry. No abrasion and no rash noted.  Psychiatric: She has a normal mood and affect. Her speech is normal and behavior is normal.    ED Course  Procedures (including critical care time) Labs Review Labs Reviewed  GLUCOSE, CAPILLARY - Abnormal; Notable for the following:    Glucose-Capillary 101 (*)    All other components within normal limits  CBC  BASIC METABOLIC PANEL  URINALYSIS, ROUTINE W REFLEX MICROSCOPIC  CG4 I-STAT (LACTIC ACID)  POCT I-STAT TROPONIN I   Imaging Review No results found.  MDM  No diagnosis found.  Date: 10/17/2012  Rate: 80  Rhythm: normal sinus rhythm  QRS Axis: normal  Intervals: normal  ST/T Wave abnormalities: nonspecific ST changes  Conduction Disutrbances:none  Narrative Interpretation:   Old EKG Reviewed: none available    3:20 PM Patient started on antibiotics for her UTI. D-dimer and troponin negative here. Patient is not orthostatic here. Will consult [facility] for admission for observation and serial troponins due to patient's right-sided chest pain  Toy Baker, MD 10/17/12 1520

## 2012-10-18 LAB — BASIC METABOLIC PANEL
Calcium: 8.9 mg/dL (ref 8.4–10.5)
Creatinine, Ser: 1.26 mg/dL — ABNORMAL HIGH (ref 0.50–1.10)
GFR calc non Af Amer: 39 mL/min — ABNORMAL LOW (ref >90)
Glucose, Bld: 91 mg/dL (ref 70–99)
Sodium: 137 mEq/L (ref 135–145)

## 2012-10-18 LAB — TROPONIN I: Troponin I: <0.3 ng/mL (ref <0.30)

## 2012-10-18 LAB — TSH: TSH: 1.429 u[IU]/mL (ref 0.350–4.500)

## 2012-10-18 MED ORDER — ENOXAPARIN SODIUM 40 MG/0.4ML ~~LOC~~ SOLN
40.0000 mg | SUBCUTANEOUS | Status: DC
  Filled 2012-10-18: qty 0.4

## 2012-10-18 MED ORDER — ENOXAPARIN SODIUM 30 MG/0.3ML ~~LOC~~ SOLN
30.0000 mg | SUBCUTANEOUS | Status: DC
  Filled 2012-10-18: qty 0.3

## 2012-10-18 MED ORDER — ENOXAPARIN SODIUM 40 MG/0.4ML ~~LOC~~ SOLN
40.0000 mg | SUBCUTANEOUS | Status: DC
  Administered 2012-10-18: 21:00:00 40 mg via SUBCUTANEOUS
  Filled 2012-10-18 (×2): qty 0.4

## 2012-10-18 NOTE — Progress Notes (Signed)
[facility] PROGRESS NOTE  Nina Wade ZOX:[id number] DOB: Nov 19, 1929 DOA: 10/17/2012 PCP: Gaye Alken, MD  Assessment/Plan:  Chest pain  -Atypical, cycled cardiac enzymes negative ,  -Continue aspirin, when necessary nitroglycerin  -She has a history of esophageal reflux,  Resolved after the use of H2 BLOCKERS.  - echo pending.  - EKG Showed NSR UTI (lower urinary tract infection)  -Obtain culture, Empiric antibiotics and follow  -Lactic acid level within normal limits, and patient afebrile with no leukocytosis  Transient hypotension/orthostatic hypotension  -more likely Secondary to hypovolemia/volume depletion  -Check orthostatics, hydrate follow  -Treating UTI as above  AKI (acute kidney injury)  -Hydrate follow and recheck  Hyponatremia  -Secondary to to volume depletion, hydrate follow and  Parkinson's disease (tremor, stiffness, slow motion, unstable posture)  -Continue outpatient medications  Code Status: full  Family Communication: None at bedside  Disposition Plan: to home whenmedically stable   Consultants:  none  Procedures:  Echo pending.  Antibiotics:  rocephin  HPI/Subjective: No new complaints, wants to go home.   Objective: Filed Vitals:   10/18/12 0452  BP: 109/62  Pulse: 67  Temp: 97.7 F (36.5 C)  Resp: 16    Intake/Output Summary (Last 24 hours) at 10/18/12 0939 Last data filed at 10/18/12 0757  Gross per 24 hour  Intake    240 ml  Output   1375 ml  Net  -1135 ml   Filed Weights   10/17/12 1650  Weight: 60.6 kg (133 lb 9.6 oz)    Exam:   General:  Alert afebrile comfortable  Cardiovascular: s1s2  Respiratory: CTAB  Abdomen: soft NT ND BS+  Musculoskeletal: NO PEDAL EDEMA   Data Reviewed: Basic Metabolic Panel:  Recent Labs Lab 10/17/12 1330 10/18/12 0603  NA 133* 137  K 3.7 3.5  CL 96 103  CO2 29 27  GLUCOSE 108* 91  BUN 23 22  CREATININE 1.39* 1.26*  CALCIUM 10.1 8.9    Liver Function Tests: No results found for this basename: AST, ALT, ALKPHOS, BILITOT, PROT, ALBUMIN,  in the last 168 hours No results found for this basename: LIPASE, AMYLASE,  in the last 168 hours No results found for this basename: AMMONIA,  in the last 168 hours CBC:  Recent Labs Lab 10/17/12 1330  WBC 6.4  HGB 12.7  HCT 38.0  MCV 86.8  PLT 199   Cardiac Enzymes:  Recent Labs Lab 10/17/12 1904 10/17/12 2335 10/18/12 0603  TROPONINI <0.30 <0.30 <0.30   BNP (last 3 results) No results found for this basename: PROBNP,  in the last 8760 hours CBG:  Recent Labs Lab 10/17/12 1353  GLUCAP 101*    No results found for this or any previous visit (from the past 240 hour(s)).   Studies: Dg Chest 2 View  10/17/2012   *RADIOLOGY REPORT*  Clinical Data: Right-sided chest pain  CHEST - 2 VIEW  Comparison: None  Findings: Mild pulmonary hyperexpansion and central bronchitic changes consistent with underlying COPD.  The cardiac and mediastinal contours within normal limits.  Tortuous and mildly ectatic thoracic aorta.  No acute osseous abnormality.  There is exaggerated thoracic kyphosis which results in the appearance of opacities in the lung bases on the frontal view.  No evidence of focal airspace consolidation, pleural effusion or pneumothorax.  IMPRESSION:  1.  No acute cardiopulmonary process. 2.  Hyperexpansion and bronchitic changes suggest underlying COPD.   Original Report Authenticated By: Malachy Moan, M.D.    Scheduled Meds: . aspirin  325 mg Oral Daily  . carbidopa-levodopa  1 tablet Oral TID  . cefTRIAXone (ROCEPHIN)  IV  1 g Intravenous Daily  . enoxaparin (LOVENOX) injection  30 mg Subcutaneous Q24H  . famotidine  20 mg Oral BID AC  . FLUoxetine  20 mg Oral Daily  . oxybutynin  10 mg Oral Daily  . sodium chloride  3 mL Intravenous Q12H   Continuous Infusions: . sodium chloride 100 mL/hr at 10/18/12 1610    Active Problems:   Parkinson's disease  (tremor, stiffness, slow motion, unstable posture)   UTI (lower urinary tract infection)   Transient hypotension   Chest pain   AKI (acute kidney injury)   Hyponatremia    Time spent: 25 min    Adeoluwa Silvers  [facility] Pager [phone]. If 7PM-7AM, please contact night-coverage at [email or web address], password [facility] 10/18/2012, 9:39 AM  LOS: 1 day

## 2012-10-19 DIAGNOSIS — I369 Nonrheumatic tricuspid valve disorder, unspecified: Secondary | ICD-10-CM

## 2012-10-19 MED ORDER — CIPROFLOXACIN HCL 500 MG PO TABS: 500.0000 mg | ORAL_TABLET | Freq: Two times a day (BID) | ORAL | Status: DC

## 2012-10-19 MED ORDER — SODIUM CHLORIDE 0.9 % IV BOLUS (SEPSIS)
1000.0000 mL | Freq: Once | INTRAVENOUS | Status: AC
  Administered 2012-10-19: 12:00:00 1000 mL via INTRAVENOUS

## 2012-10-19 MED ORDER — FAMOTIDINE 20 MG PO TABS: 20.0000 mg | ORAL_TABLET | Freq: Two times a day (BID) | ORAL | Status: DC

## 2012-10-19 NOTE — Progress Notes (Signed)
  Echocardiogram 2D Echocardiogram has been performed.  Nina Wade 10/19/2012, 11:43 AM

## 2012-10-19 NOTE — Discharge Summary (Signed)
Physician Discharge Summary  Nina Vanrossum ZOX:[id number] DOB: 12/12/1929 DOA: 10/17/2012  PCP: Gaye Alken, MD  Admit date: 10/17/2012 Discharge date: 10/19/2012  Time spent: 30 minutes  Recommendations for Outpatient Follow-up:  Follow up urine culture reports Follow up with pcp in one week Follow up with BMP in one week.  Discharge Diagnoses:  Active Problems:   Parkinson's disease (tremor, stiffness, slow motion, unstable posture)   UTI (lower urinary tract infection)   Transient hypotension   Chest pain   AKI (acute kidney injury)   Hyponatremia   Discharge Condition: improved.   Diet recommendation: regular  Filed Weights   10/17/12 1650  Weight: 60.6 kg (133 lb 9.6 oz)    History of present illness:  Nina Wade is a 79 y.o. female history of hypertension, CVA, esophageal reflux and other past medical history as listed below who presents with above complaints. She states that the the past week she's had progressive weakness in right-sided chest pain-below her right breast. She describes the chest pain as intermittent sharp worsened to 8/10 intensity today and so she came to the ED. She denies associated nausea vomiting diaphoresis and no shortness of breath. She states that she's had decreased by mouth intake for a good while. As ready mentioned she denies nausea vomiting and no dysphagia. She states that she has a lot on her taking care of her son with disabilities and also her husband was dementia. She denies fevers, dysuria, cough, diarrhea melena and no hematochezia. She was seen in the ED and a chest x-ray showed no acute cardiopulmonary process, d-dimer was within normal limits, troponin negative and urinalysis was consistent with a UTI. She is admitted for further evaluation and management.   Hospital Course:  Chest pain  -Atypical, cycled cardiac enzymes negative ,  -Continue aspirin, when necessary nitroglycerin as needed.  -She  has a history of esophageal reflux, Resolved after the use of H2 BLOCKERS.  - echo showed LV EF and grade 1 diastolic dysfunction.  - EKG Showed NSR  UTI (lower urinary tract infection)  -, Empiric antibiotics and discharged on oral cipro.   -Lactic acid level within normal limits, and patient afebrile with no leukocytosis  Transient hypotension/orthostatic hypotension  -more likely Secondary to hypovolemia/volume depletion  -Treating UTI as above . Resolved after hydration.  AKI (acute kidney injury)  -improved.  Hyponatremia  -Secondary to to volume depletion, hydrate follow and show improvement on discharge.  Parkinson's disease (tremor, stiffness, slow motion, unstable posture)  -Continue outpatient medications    Procedures:  Echocardiogram shows good LV EF and grade 1 diastolic dysfunction  Consultations:  none  Discharge Exam: Filed Vitals:   10/19/12 0506  BP: 125/64  Pulse: 75  Temp:   Resp:     General: alert afebrile comfortable Cardiovascular: s1s2 Respiratory: ctab  Discharge Instructions  Discharge Orders   Future Appointments Provider Department Dept Phone   01/06/2013 10:00 AM Ronal Fear, NP [facility] [phone]   Future Orders Complete By Expires   Diet - low sodium heart healthy  As directed    Discharge instructions  As directed    Comments:     Follow up with PCP in one week.   Increase activity slowly  As directed        Medication List    STOP taking these medications       valsartan-hydrochlorothiazide 160-12.5 MG per tablet  Commonly known as:  DIOVAN-HCT      TAKE these  medications       aspirin 325 MG tablet  Take 325 mg by mouth daily.     carbidopa-levodopa 25-100 MG per tablet  Commonly known as:  SINEMET IR  Take 1 tablet by mouth 3 (three) times daily. Take 30 minutes before meals.     ciprofloxacin 500 MG tablet  Commonly known as:  CIPRO  Take 1 tablet (500 mg total) by mouth 2 (two) times  daily.     famotidine 20 MG tablet  Commonly known as:  PEPCID  Take 1 tablet (20 mg total) by mouth 2 (two) times daily before a meal.     FLUoxetine 20 MG capsule  Commonly known as:  PROZAC  Take 20 mg by mouth daily.     oxybutynin 10 MG 24 hr tablet  Commonly known as:  DITROPAN-XL  Take 10 mg by mouth daily.     polyethylene glycol powder powder  Commonly known as:  GLYCOLAX/MIRALAX  Take 17 g by mouth daily as needed (for constipation).       No Known Allergies     Follow-up Information   Follow up with Gaye Alken, MD. Schedule an appointment as soon as possible for a visit in 1 week.   Specialty:  Family Medicine   Contact information:   [address] [place] [address] [phone]        The results of significant diagnostics from this hospitalization (including imaging, microbiology, ancillary and laboratory) are listed below for reference.    Significant Diagnostic Studies: Dg Chest 2 View  10/17/2012   *RADIOLOGY REPORT*  Clinical Data: Right-sided chest pain  CHEST - 2 VIEW  Comparison: None  Findings: Mild pulmonary hyperexpansion and central bronchitic changes consistent with underlying COPD.  The cardiac and mediastinal contours within normal limits.  Tortuous and mildly ectatic thoracic aorta.  No acute osseous abnormality.  There is exaggerated thoracic kyphosis which results in the appearance of opacities in the lung bases on the frontal view.  No evidence of focal airspace consolidation, pleural effusion or pneumothorax.  IMPRESSION:  1.  No acute cardiopulmonary process. 2.  Hyperexpansion and bronchitic changes suggest underlying COPD.   Original Report Authenticated By: Malachy Moan, M.D.    Microbiology: Recent Results (from the past 240 hour(s))  URINE CULTURE     Status: None   Collection Time    10/17/12  2:15 PM      Result Value Range Status   Specimen Description URINE, CLEAN CATCH   Final   Special Requests NONE    Final   Culture  Setup Time     Final   Value: 10/17/2012 22:32     Performed at [facility] Count     Final   Value: >=100,000 COLONIES/ML     Performed at [facility]   Culture     Final   Value: ESCHERICHIA COLI     Performed at [facility]   Report Status PENDING   Incomplete     Labs: Basic Metabolic Panel:  Recent Labs Lab 10/17/12 1330 10/18/12 0603  NA 133* 137  K 3.7 3.5  CL 96 103  CO2 29 27  GLUCOSE 108* 91  BUN 23 22  CREATININE 1.39* 1.26*  CALCIUM 10.1 8.9   Liver Function Tests: No results found for this basename: AST, ALT, ALKPHOS, BILITOT, PROT, ALBUMIN,  in the last 168 hours No results found for this basename: LIPASE, AMYLASE,  in the  last 168 hours No results found for this basename: AMMONIA,  in the last 168 hours CBC:  Recent Labs Lab 10/17/12 1330  WBC 6.4  HGB 12.7  HCT 38.0  MCV 86.8  PLT 199   Cardiac Enzymes:  Recent Labs Lab 10/17/12 1904 10/17/12 2335 10/18/12 0603  TROPONINI <0.30 <0.30 <0.30   BNP: BNP (last 3 results) No results found for this basename: PROBNP,  in the last 8760 hours CBG:  Recent Labs Lab 10/17/12 1353  GLUCAP 101*       Signed:  Kiearra Oyervides  [facility] 10/19/2012, 1:17 PM

## 2012-10-19 NOTE — Evaluation (Signed)
Physical Therapy Evaluation Patient Details Name: Nina Wade MRN: [id number] DOB: [birth year] Today's Date: 10/19/2012 Time: 4098-1191 PT Time Calculation (min): 15 min  PT Assessment / Plan / Recommendation History of Present Illness  Admitted 10/17/12 with hypostension, chest pain. Positive for UTI  Clinical Impression  Pt ambulating near baseline per pt and family. Pt hopeful for DC today. Pt declines HHPT. Pt will benefit from PT while in acute care to return to safe independent ambulation with no AD.    PT Assessment  Patient needs continued PT services    Follow Up Recommendations  No PT follow up    Does the patient have the potential to tolerate intense rehabilitation      Barriers to Discharge        Equipment Recommendations  None recommended by PT    Recommendations for Other Services     Frequency Min 3X/week    Precautions / Restrictions Precautions Precautions: Fall   Pertinent Vitals/Pain none      Mobility  Bed Mobility Bed Mobility: Supine to Sit;Sitting - Scoot to Edge of Bed Supine to Sit: 7: Independent Sitting - Scoot to Edge of Bed: 7: Independent Transfers Transfers: Sit to Stand;Stand to Sit Sit to Stand: 5: Supervision;From bed;From chair/3-in-1;With armrests Stand to Sit: To chair/3-in-1;With upper extremity assist Details for Transfer Assistance: verbal cues for pt to use armrests tor safety Ambulation/Gait Ambulation/Gait Assistance: 4: Min guard Ambulation Distance (Feet): 100 Feet Assistive device: Rolling walker Ambulation/Gait Assistance Details: Ambulated again x 80 with no Rw and was steady. No holding onto rail either. Gait Pattern: Within Functional Limits    Exercises     PT Diagnosis: Generalized weakness  PT Problem List: Decreased strength;Decreased activity tolerance;Decreased safety awareness PT Treatment Interventions: DME instruction;Gait training;Functional mobility training;Therapeutic  exercise;Patient/family education     PT Goals(Current goals can be found in the care plan section) Acute Rehab PT Goals Patient Stated Goal: I want to go home. PT Goal Formulation: With patient/family Time For Goal Achievement: 11/02/12 Potential to Achieve Goals: Good  Visit Information  Last PT Received On: 10/19/12 Assistance Needed: +1 History of Present Illness: Admitted 10/17/12 with hypostension, chest pain. Positive for UTI       Prior Functioning  Home Living Family/patient expects to be discharged to:: Private residence Living Arrangements: Spouse/significant other;Children Available Help at Discharge: Family Type of Home: House Home Access: Ramped entrance Home Layout: One level Home Equipment: Environmental consultant - 4 wheels;Walker - 2 wheels;Wheelchair - manual Prior Function Level of Independence: Independent Communication Communication: No difficulties    Cognition       Extremity/Trunk Assessment Upper Extremity Assessment Upper Extremity Assessment: Overall WFL for tasks assessed Lower Extremity Assessment Lower Extremity Assessment: Overall WFL for tasks assessed   Balance Balance Balance Assessed: Yes Static Sitting Balance Static Sitting - Level of Assistance: 5: Stand by assistance  End of Session PT - End of Session Equipment Utilized During Treatment: Gait belt Activity Tolerance: Patient tolerated treatment well Patient left: in chair;with call bell/phone within reach;with family/visitor present Nurse Communication: Mobility status  GP     Rada Hay 10/19/2012, 12:06 PM Blanchard Kelch PT [phone]

## 2012-10-20 LAB — URINE CULTURE

## 2013-01-06 ENCOUNTER — Encounter: Payer: Self-pay | Admitting: Nurse Practitioner

## 2013-01-06 ENCOUNTER — Ambulatory Visit (INDEPENDENT_AMBULATORY_CARE_PROVIDER_SITE_OTHER): Payer: Medicare Other | Admitting: Nurse Practitioner

## 2013-01-06 VITALS — BP 109/74 | HR 111 | Ht 64.0 in | Wt 133.0 lb

## 2013-01-06 DIAGNOSIS — G20A1 Parkinson's disease without dyskinesia, without mention of fluctuations: Secondary | ICD-10-CM

## 2013-01-06 DIAGNOSIS — G20 Parkinson's disease: Secondary | ICD-10-CM

## 2013-01-06 MED ORDER — CARBIDOPA-LEVODOPA 25-100 MG PO TABS: 1.0000 | ORAL_TABLET | Freq: Four times a day (QID) | ORAL | Status: DC

## 2013-01-06 NOTE — Patient Instructions (Addendum)
PLAN:  1. continue carbidopa/levodopa, 25-100 mg, 1 tab 30-60 minutes before meals to make sure it is absorbed. Try adding 1 more dose during the day, take 1 tablet every 4 hours.  Please call our office to give [place] an update on your symptoms with the increase in medicine.  2. urge use of cane or walker to prevent falls if needed. 3. follow up with Dr. Marjory Lies in 6 months.

## 2013-01-06 NOTE — Progress Notes (Signed)
[facility]  PATIENT: Nina Wade DOB: 1929/12/08   REASON FOR VISIT: follow up for PD. HISTORY FROM: patient  HISTORY OF PRESENT ILLNESS: UPDATE 01/06/13 (LL): 79 year old left-handed female, has been 6 months since last visit. She states there has been no real change, except tremor in right upper extremity is worse.  She inquires if there is a medication to make it better.  She does not see any change when taking Sinemet, but she is taking it three times a day.  Her 23 year old daughter has been diagnosed with Parkinson's recently.  No falls.  UPDATE 07/09/12 (VRP): 79 year old left-handed female, has been 6 months since last visit. She states that she feels like she is doing the same. No falls since her last visit. She is taking the carb/levo three times a day at 8, 12, and in the evening, but tells me she doesn't feel any different when she takes it or can't tell that it makes a difference. She is experiencing constipation, and takes Miralax prn and estimates her BMs to be every other day. Her main complaint is fatigue. She says she is walking ok without the cane or walker. She does not think her tremors are any worse.  UPDATE 11/19/11: Patient forgot to follow up. Has been 1.5 years since last visit/ [place], she tells me that she has been taking only 1 tab of carb/levo per day (says this was written on the bottle). Many more falls lately. Using a 4 point cane; got a rolling walker with brakes last friday. Has tried PT, but now discharged.  UPDATE 04/19/10: Doing about the same. Think her tremor slightly improved since starting carb/levo. Has been to PT and doing well. No falls. No side effects.  PRIOR HPI: 79 year old left-handed female with history of hypertension and depression presenting for evaluation of tremor since March 2010.  Patient reports progressive right upper extremity tremor. She was initially evaluated [facility] by Dr. Conrad [place], who raised the  possibility of Parkinson's disease but did not start treatment since her tremor was mild and improving. Now tremor is worse. No tremor in her left hand. She denies smell or taste problems, vivid dreams, but does have balance and walking problems. She recently fell down when power went out and she tripped in the dark. No family history of Parkinson's disease. The patient thinks that her symptoms started after her multiple orthopedic surgeries which were complicated by infection.   REVIEW OF SYSTEMS: Full 14 system review of systems performed and notable only for tremor.  ALLERGIES: No Known Allergies  HOME MEDICATIONS: Outpatient Prescriptions Prior to Visit  Medication Sig Dispense Refill  . aspirin 325 MG tablet Take 325 mg by mouth daily.      . ciprofloxacin (CIPRO) 500 MG tablet Take 1 tablet (500 mg total) by mouth 2 (two) times daily.  8 tablet  0  . famotidine (PEPCID) 20 MG tablet Take 1 tablet (20 mg total) by mouth 2 (two) times daily before a meal.  30 tablet  0  . FLUoxetine (PROZAC) 20 MG capsule Take 20 mg by mouth daily.       Marland Kitchen oxybutynin (DITROPAN-XL) 10 MG 24 hr tablet Take 10 mg by mouth daily.      . polyethylene glycol powder (GLYCOLAX/MIRALAX) powder Take 17 g by mouth daily as needed (for constipation).       . carbidopa-levodopa (SINEMET IR) 25-100 MG per tablet Take 1 tablet by mouth 3 (three)  times daily. Take 30 minutes before meals.  90 tablet  12   No facility-administered medications prior to visit.    PAST MEDICAL HISTORY: Past Medical History  Diagnosis Date  . Arthritis   . Hypertension   . Anxiety   . Interstitial cystitis   . B12 deficiency   . Tremor   . CVA (cerebral infarction)   . Osteomyelitis   . Renal cyst   . Incontinence   . Esophageal reflux   . Anemia   . Depressive disorder   . Parkinson disease     PAST SURGICAL HISTORY: Past Surgical History  Procedure Laterality Date  . Total hip arthroplasty    . Total knee arthroplasty      . Leg surgery Right     x6  . Total vaginal hysterectomy      FAMILY HISTORY: Family History  Problem Relation Age of Onset  . Cancer Father   . Heart Problems Mother   . Cancer Brother     SOCIAL HISTORY: History   Social History  . Marital Status: Single    Spouse Name: N/A    Number of Children: 3  . Years of Education: HS   Occupational History  . Retired    Social History Main Topics  . Smoking status: Never Smoker   . Smokeless tobacco: Never Used  . Alcohol Use: No  . Drug Use: No  . Sexual Activity: No   Other Topics Concern  . Not on file   Social History Narrative   Pt lives at home with her spouse.   She has been married for 58+ years.   Caffeine Use: Does not consume     PHYSICAL EXAM  Filed Vitals:   01/06/13 0936  BP: 109/74  Pulse: 111  Height: 5\' 4"  (1.626 m)  Weight: 133 lb (60.328 kg)   Body mass index is 22.82 kg/(m^2).  Generalized: Well developed, in no acute distress, well groomed. Head: normocephalic and atraumatic. Oropharynx benign  Neck: Supple, no carotid bruits  Cardiac: Regular rate rhythm, no murmur  Musculoskeletal: No deformity   Neurological examination  Mental Status: Awake, alert. Language is fluent and comprehension intact. Soft, trembling voice. POSITIVE MYERSON'S SIGN.  Cranial Nerves: Pupils are equal and reactive to light. Visual fields are full to confrontation. Conjugate eye movements are full and symmetric. Facial sensation and strength are symmetric. Hearing is intact. Palate elevated symmetrically and uvula is midline. Shoulder shug is symmetric. Tongue is midline.  Motor: Normal bulk and COGWHEEL RIGIDITY IN RUE > LUE. REST TREMOR IN RUE, WITH MILD INTENTION TREMOR.  MARKED BRADYKINESIA IN BUE AND BLE (RIGHT WORSE THAN LEFT). Full strength in the upper and lower extremities. No pronator drift.  Sensory: Intact and symmetric to light touch, temperature, vibration.  Coordination: MILD DYSMETRIA IN RUE.   Gait and Station: Narrow based gait; SHORT, SHUFFLING STEPS.  MILDLY STOOPED POSTURE. POOR ARM SWING; RUE TREMOR. UNSTEADY.  Able to get up from sitting without assistance, pushes up with one hand only. Reflexes: Deep tendon reflexes in the UPPER EXT ARE TRACE; ABSENT IN LEGS.  DIAGNOSTIC DATA (LABS, IMAGING, TESTING) - I reviewed patient records, labs, notes, testing and imaging myself where available.  Lab Results  Component Value Date   WBC 6.4 10/17/2012   HGB 12.7 10/17/2012   HCT 38.0 10/17/2012   MCV 86.8 10/17/2012   PLT 199 10/17/2012      Component Value Date/Time   NA 137 10/18/2012 0603   K  3.5 10/18/2012 0603   CL 103 10/18/2012 0603   CO2 27 10/18/2012 0603   GLUCOSE 91 10/18/2012 0603   BUN 22 10/18/2012 0603   CREATININE 1.26* 10/18/2012 0603   CALCIUM 8.9 10/18/2012 0603   GFRNONAA 39* 10/18/2012 0603   GFRAA 45* 10/18/2012 0603   No results found for this basename: CHOL,  HDL,  LDLCALC,  LDLDIRECT,  TRIG,  CHOLHDL   No results found for this basename: HGBA1C   No results found for this basename: VITAMINB12   Lab Results  Component Value Date   TSH 1.429 10/17/2012    ASSESSMENT AND PLAN 79 y.o. female with hypertension, depression, with progressive tremor and gait difficulty since March 2010. Exam notable for right arm resting tremor, cogwheel rigidity, bradykinesia and postural instability. Has 4/4 cardinal signs of Parkinson's disease. Mild response to carb/levo.  Increased resting tremor of right hand since last visit.   PLAN:  1.increase carbidopa/levodopa, 25-100 mg, 1 tab 4 times daily, every 4 hours.  Make sure to take it 30 minutes before meals to make sure it is absorbed.  Please call our office in 1 month to give [place] an update on your symptoms with the increase in medicine. 2. urge use of cane or walker to prevent falls  3. follow up with Dr. Marjory Lies in 6 months.  No orders of the defined types were placed in this encounter.    Meds ordered this encounter  Medications   . carbidopa-levodopa (SINEMET IR) 25-100 MG per tablet    Sig: Take 1 tablet by mouth 4 (four) times daily. Take 30 minutes before meals.    Dispense:  120 tablet    Refill:  12    Order Specific Question:  Supervising Provider    Answer:  Joycelyn Schmid R [[id number]]    Tawny Asal Tiarrah Saville, MSN, NP-C 01/06/2013, 10:11 AM [facility] [address] [place], [place] [address] [phone]  Note: This document was prepared with digital dictation and possible smart phrase technology. Any transcriptional errors that result from this process are unintentional.

## 2013-01-22 NOTE — Progress Notes (Signed)
I reviewed note and agree with plan.   Damontae Loppnow R. Anan Dapolito, MD  Certified in Neurology, Neurophysiology and Neuroimaging  [facility] [address], [address] [place], [place] [address] [phone]   

## 2013-07-09 ENCOUNTER — Ambulatory Visit (INDEPENDENT_AMBULATORY_CARE_PROVIDER_SITE_OTHER): Payer: Medicare Other | Admitting: Diagnostic Neuroimaging

## 2013-07-09 ENCOUNTER — Encounter: Payer: Self-pay | Admitting: Diagnostic Neuroimaging

## 2013-07-09 VITALS — BP 126/68 | HR 98 | Temp 98.4°F | Ht 60.0 in | Wt 127.5 lb

## 2013-07-09 DIAGNOSIS — G20 Parkinson's disease: Secondary | ICD-10-CM

## 2013-07-09 NOTE — Patient Instructions (Signed)
Increase carb/levo up to 1.5 tabs three times per day for 1 month, then increase to 2 tabs three times per day.  Use walker.  Discuss with [place] social worker about your home situation.

## 2013-07-09 NOTE — Progress Notes (Signed)
[facility]  PATIENT: Nina Wade DOB: 20-Oct-1929  REFERRING CLINICIAN:  HISTORY FROM: patient  REASON FOR VISIT: follow up   HISTORICAL  CHIEF COMPLAINT:  Chief Complaint  Patient presents with  . Follow-up    PD    HISTORY OF PRESENT ILLNESS:   UPDATE 07/09/13: Since last visit, not doing well. Did not increase her carb/levo as instructed. Not paying attention to on/off fluctuations or wearing off. She is under a lot of stress. Having to take care of her adult son with cerebral palsy (45yrs old), and husband with dementia. Her daughter and son in law live across the street and help a little bit. Getting some help from [facility]. Also c/o right shoulder pain, anxiety and nervousness.  UPDATE 01/06/13 (LL): 80 year old left-handed female, has been 6 months since last visit. She states there has been no real change, except tremor in right upper extremity is worse. She inquires if there is a medication to make it better. She does not see any change when taking Sinemet, but she is taking it three times a day. Her 66 year old daughter has been diagnosed with Parkinson's recently. No falls.   UPDATE 07/09/12 (VRP): 80 year old left-handed female, has been 6 months since last visit. She states that she feels like she is doing the same. No falls since her last visit. She is taking the carb/levo three times a day at 8, 12, and in the evening, but tells me she doesn't feel any different when she takes it or can't tell that it makes a difference. She is experiencing constipation, and takes Miralax prn and estimates her BMs to be every other day. Her main complaint is fatigue. She says she is walking ok without the cane or walker. She does not think her tremors are any worse.   UPDATE 11/19/11 (VRP): Patient forgot to follow up. Has been 1.5 years since last visit/ [place], she tells me that she has been taking only 1 tab of carb/levo per day (says this was  written on the bottle). Many more falls lately. Using a 4 point cane; got a rolling walker with brakes last friday. Has tried PT, but now discharged.   UPDATE 04/19/10 (VRP): Doing about the same. Think her tremor slightly improved since starting carb/levo. Has been to PT and doing well. No falls. No side effects.   PRIOR HPI (01/17/10, VRP): 80 year old left-handed female with history of hypertension and depression presenting for evaluation of tremor since March 2010. Patient reports progressive right upper extremity tremor. She was initially evaluated [facility] by Dr. Conrad [place], who raised the possibility of Parkinson's disease but did not start treatment since her tremor was mild and improving. Now tremor is worse. No tremor in her left hand. She denies smell or taste problems, vivid dreams, but does have balance and walking problems. She recently fell down when power went out and she tripped in the dark. No family history of Parkinson's disease. The patient thinks that her symptoms started after her multiple orthopedic surgeries which were complicated by infection.    REVIEW OF SYSTEMS: Full 14 system review of systems performed and notable only for aching muscles muscle cramps neck pain  ALLERGIES: No Known Allergies  HOME MEDICATIONS: Outpatient Prescriptions Prior to Visit  Medication Sig Dispense Refill  . carbidopa-levodopa (SINEMET IR) 25-100 MG per tablet Take 1 tablet by mouth 4 (four) times daily. Take 30 minutes before meals.  120 tablet  12  .  ciprofloxacin (CIPRO) 500 MG tablet Take 1 tablet (500 mg total) by mouth 2 (two) times daily.  8 tablet  0  . FLUoxetine (PROZAC) 20 MG capsule Take 20 mg by mouth daily.       Marland Kitchen oxybutynin (DITROPAN-XL) 10 MG 24 hr tablet Take 10 mg by mouth daily.      . polyethylene glycol powder (GLYCOLAX/MIRALAX) powder Take 17 g by mouth daily as needed (for constipation).       Marland Kitchen aspirin 325 MG tablet Take 325 mg by mouth daily.      . famotidine (PEPCID) 20  MG tablet Take 1 tablet (20 mg total) by mouth 2 (two) times daily before a meal.  30 tablet  0   No facility-administered medications prior to visit.    PAST MEDICAL HISTORY: Past Medical History  Diagnosis Date  . Arthritis   . Hypertension   . Anxiety   . Interstitial cystitis   . B12 deficiency   . Tremor   . CVA (cerebral infarction)   . Osteomyelitis   . Renal cyst   . Incontinence   . Esophageal reflux   . Anemia   . Depressive disorder   . Parkinson disease     PAST SURGICAL HISTORY: Past Surgical History  Procedure Laterality Date  . Total hip arthroplasty    . Total knee arthroplasty    . Leg surgery Right     x6  . Total vaginal hysterectomy      FAMILY HISTORY: Family History  Problem Relation Age of Onset  . Cancer Father   . Heart Problems Mother   . Cancer Brother     SOCIAL HISTORY:  History   Social History  . Marital Status: Single    Spouse Name: N/A    Number of Children: 3  . Years of Education: HS   Occupational History  . Retired    Social History Main Topics  . Smoking status: Never Smoker   . Smokeless tobacco: Never Used  . Alcohol Use: No  . Drug Use: No  . Sexual Activity: No   Other Topics Concern  . Not on file   Social History Narrative   Pt lives at home with her spouse.   She has been married for 58+ years.   Caffeine Use: Does not consume     PHYSICAL EXAM  Filed Vitals:   07/09/13 1147  BP: 126/68  Pulse: 98  Temp: 98.4 F (36.9 C)  TempSrc: Oral  Height: 5' (1.524 m)  Weight: 127 lb 8 oz (57.834 kg)    Not recorded    Body mass index is 24.9 kg/(m^2).  Generalized: Well developed, in no acute distress, well groomed.  Head: normocephalic and atraumatic. Oropharynx benign  Neck: Supple, no carotid bruits  Cardiac: Regular rate rhythm, no murmur  Musculoskeletal: No deformity   Neurological examination  Mental Status: Awake, alert. Language is fluent and comprehension intact. Soft,  trembling voice. POSITIVE MYERSON'S SIGN.  Cranial Nerves: Pupils are equal and reactive to light. Visual fields are full to confrontation. Conjugate eye movements are full and symmetric. Facial sensation and strength are symmetric. Hearing is intact. Palate elevated symmetrically and uvula is midline. Shoulder shug is symmetric. Tongue is midline.  Motor: Normal bulk and COGWHEEL RIGIDITY IN RUE > LUE. REST TREMOR IN RUE > LUE, WITH MILD INTENTION TREMOR. MARKED BRADYKINESIA IN BUE AND BLE (RIGHT WORSE THAN LEFT). Full strength in the upper and lower extremities. No pronator drift.  Sensory:  Intact and symmetric to light touch, temperature, vibration.  Coordination: MILD DYSMETRIA IN RUE.  Gait and Station: Narrow based gait; SHORT, SHUFFLING STEPS. MILDLY STOOPED POSTURE. POOR ARM SWING; RUE TREMOR. UNSTEADY. Able to get up from sitting without assistance, pushes up with one hand only.  Reflexes: Deep tendon reflexes in the UPPER EXT ARE TRACE; ABSENT IN LEGS.     DIAGNOSTIC DATA (LABS, IMAGING, TESTING) - I reviewed patient records, labs, notes, testing and imaging myself where available.  Lab Results  Component Value Date   WBC 6.4 10/17/2012   HGB 12.7 10/17/2012   HCT 38.0 10/17/2012   MCV 86.8 10/17/2012   PLT 199 10/17/2012      Component Value Date/Time   NA 137 10/18/2012 0603   K 3.5 10/18/2012 0603   CL 103 10/18/2012 0603   CO2 27 10/18/2012 0603   GLUCOSE 91 10/18/2012 0603   BUN 22 10/18/2012 0603   CREATININE 1.26* 10/18/2012 0603   CALCIUM 8.9 10/18/2012 0603   GFRNONAA 39* 10/18/2012 0603   GFRAA 45* 10/18/2012 0603   No results found for this basename: CHOL, HDL, LDLCALC, LDLDIRECT, TRIG, CHOLHDL   No results found for this basename: HGBA1C   No results found for this basename: VITAMINB12   Lab Results  Component Value Date   TSH 1.429 10/17/2012    03/24/10 CT head (without contrast) demonstrating: 1. Incidentally noted bilateral ossicular reconstruction procedure.   2. Minimal  chronic small vessel ischemic disease. 3. Compared to the prior CT head from 04/09/08, the previously noted right caudate ischemic infarction is no longer clearly seen.   ASSESSMENT AND PLAN  80 y.o. year old female here with hypertension, depression, with progressive tremor and gait difficulty since March 2010. Exam notable for right arm resting tremor, cogwheel rigidity, bradykinesia and postural instability. Has 4/4 cardinal signs of Parkinson's disease. Mild response to carb/levo. Increased tremor, anxiety, right shoulder pain. Significant psychosocial stress as well.  PLAN:  1. increase carbidopa/levodopa (25/100) up to 1.5tabs TID x 1 month, then 2tabs TID  2. use walker to prevent falls   Return in about 6 months (around 01/09/2014).    Suanne MarkerVIKRAM R. PENUMALLI, MD 07/09/2013, 12:28 PM Certified in Neurology, Neurophysiology and Neuroimaging  [facility] [address], [address] [place], [place] [address] [phone]

## 2013-08-01 ENCOUNTER — Other Ambulatory Visit: Payer: Self-pay | Admitting: Neurology

## 2013-08-13 ENCOUNTER — Other Ambulatory Visit (HOSPITAL_COMMUNITY): Payer: Medicare Other

## 2013-08-19 ENCOUNTER — Ambulatory Visit (HOSPITAL_COMMUNITY): Admission: RE | Admit: 2013-08-19 | Payer: Medicare Other | Source: Ambulatory Visit | Admitting: Orthopedic Surgery

## 2013-08-19 ENCOUNTER — Encounter (HOSPITAL_COMMUNITY): Admission: RE | Payer: Self-pay | Source: Ambulatory Visit

## 2013-08-19 SURGERY — BUNIONECTOMY
Anesthesia: General | Site: Foot | Laterality: Left

## 2014-01-13 ENCOUNTER — Ambulatory Visit (INDEPENDENT_AMBULATORY_CARE_PROVIDER_SITE_OTHER): Payer: Medicare Other | Admitting: Diagnostic Neuroimaging

## 2014-01-13 ENCOUNTER — Encounter: Payer: Self-pay | Admitting: Diagnostic Neuroimaging

## 2014-01-13 VITALS — BP 113/70 | HR 76 | Temp 98.1°F | Ht 60.0 in | Wt 131.8 lb

## 2014-01-13 DIAGNOSIS — G20 Parkinson's disease: Secondary | ICD-10-CM

## 2014-01-13 MED ORDER — CARBIDOPA-LEVODOPA 25-100 MG PO TABS: 2.0000 | ORAL_TABLET | Freq: Three times a day (TID) | ORAL | Status: DC

## 2014-01-13 NOTE — Progress Notes (Signed)
[facility]  PATIENT: Nina Wade DOB: [birth year]  REFERRING CLINICIAN:  HISTORY FROM: patient  REASON FOR VISIT: follow up   HISTORICAL  CHIEF COMPLAINT:  Chief Complaint  Patient presents with  . Follow-up    PD  . Aphasia    HISTORY OF PRESENT ILLNESS:   UPDATE 01/13/14: Since last visit, did not increase carb/levo. Today here with son. Last fall 3 months ago. Not using walker. Sometimes uses a cane, but carries it without placing on the ground. Tremors are worse.  UPDATE 07/09/13: Since last visit, not doing well. Did not increase her carb/levo as instructed. Not paying attention to on/off fluctuations or wearing off. She is under a lot of stress. Having to take care of her adult son with cerebral palsy (50yrs old), and husband with dementia. Her daughter and son in law live across the street and help a little bit. Getting some help from [facility]. Also c/o right shoulder pain, anxiety and nervousness.  UPDATE 01/06/13 (LL): 80 year old left-handed female, has been 6 months since last visit. She states there has been no real change, except tremor in right upper extremity is worse. She inquires if there is a medication to make it better. She does not see any change when taking Sinemet, but she is taking it three times a day. Her 16 year old daughter has been diagnosed with Parkinson's recently. No falls.   UPDATE 07/09/12 (VRP): 80 year old left-handed female, has been 6 months since last visit. She states that she feels like she is doing the same. No falls since her last visit. She is taking the carb/levo three times a day at 8, 12, and in the evening, but tells me she doesn't feel any different when she takes it or can't tell that it makes a difference. She is experiencing constipation, and takes Miralax prn and estimates her BMs to be every other day. Her main complaint is fatigue. She says she is walking ok without the cane or walker. She  does not think her tremors are any worse.   UPDATE 11/19/11 (VRP): Patient forgot to follow up. Has been 1.5 years since last visit/ [facility], she tells me that she has been taking only 1 tab of carb/levo per day (says this was written on the bottle). Many more falls lately. Using a 4 point cane; got a rolling walker with brakes last friday. Has tried PT, but now discharged.   UPDATE 04/19/10 (VRP): Doing about the same. Think her tremor slightly improved since starting carb/levo. Has been to PT and doing well. No falls. No side effects.   PRIOR HPI (01/17/10, VRP): 80 year old left-handed female with history of hypertension and depression presenting for evaluation of tremor since March 2010. Patient reports progressive right upper extremity tremor. She was initially evaluated [facility] by Dr. Conrad Thomson, who raised the possibility of Parkinson's disease but did not start treatment since her tremor was mild and improving. Now tremor is worse. No tremor in her left hand. She denies smell or taste problems, vivid dreams, but does have balance and walking problems. She recently fell down when power went out and she tripped in the dark. No family history of Parkinson's disease. The patient thinks that her symptoms started after her multiple orthopedic surgeries which were complicated by infection.    REVIEW OF SYSTEMS: Full 14 system review of systems performed and notable only for aching muscles muscle cramps neck pain.  ALLERGIES: No Known Allergies  HOME MEDICATIONS:  Outpatient Prescriptions Prior to Visit  Medication Sig Dispense Refill  . Calcium Carbonate Antacid (TUMS E-X PO) Take 1 tablet by mouth daily.    . ciprofloxacin (CIPRO) 500 MG tablet Take 1 tablet (500 mg total) by mouth 2 (two) times daily. 8 tablet 0  . FLUoxetine (PROZAC) 20 MG capsule Take 20 mg by mouth daily.     Marland Kitchen. ibuprofen (ADVIL,MOTRIN) 200 MG tablet Take 200 mg by mouth every 6 (six) hours as needed.    Marland Kitchen. oxybutynin (DITROPAN-XL) 10 MG  24 hr tablet Take 10 mg by mouth daily.    . polyethylene glycol powder (GLYCOLAX/MIRALAX) powder Take 17 g by mouth daily as needed (for constipation).     . carbidopa-levodopa (SINEMET IR) 25-100 MG per tablet 1.5 tabs TID x 1 month, then 2 tabs TID (Patient taking differently: Take 1 tablet by mouth 4 (four) times daily. 1.5 tabs TID x 1 month, then 2 tabs TID) 180 tablet 6   No facility-administered medications prior to visit.    PAST MEDICAL HISTORY: Past Medical History  Diagnosis Date  . Arthritis   . Hypertension   . Anxiety   . Interstitial cystitis   . B12 deficiency   . Tremor   . CVA (cerebral infarction)   . Osteomyelitis   . Renal cyst   . Incontinence   . Esophageal reflux   . Anemia   . Depressive disorder   . Parkinson disease     PAST SURGICAL HISTORY: Past Surgical History  Procedure Laterality Date  . Total hip arthroplasty    . Total knee arthroplasty    . Leg surgery Right     x6  . Total vaginal hysterectomy      FAMILY HISTORY: Family History  Problem Relation Age of Onset  . Cancer Father   . Heart Problems Mother   . Cancer Brother     SOCIAL HISTORY:  History   Social History  . Marital Status: Single    Spouse Name: N/A    Number of Children: 3  . Years of Education: HS   Occupational History  . Retired    Social History Main Topics  . Smoking status: Never Smoker   . Smokeless tobacco: Never Used  . Alcohol Use: No  . Drug Use: No  . Sexual Activity: No   Other Topics Concern  . Not on file   Social History Narrative   Pt lives at home with her spouse.   She has been married for 58+ years.   Caffeine Use: Does not consume     PHYSICAL EXAM  Filed Vitals:   01/13/14 1202  BP: 113/70  Pulse: 76  Temp: 98.1 F (36.7 C)  TempSrc: Oral  Height: 5' (1.524 m)  Weight: 131 lb 12.8 oz (59.784 kg)    Not recorded      Body mass index is 25.74 kg/(m^2).  Generalized: Well developed, in no acute distress,  well groomed.  Neck: Supple, no carotid bruits  Cardiac: Regular rate rhythm, no murmur   Neurological examination  Mental Status: Awake, alert. Language is fluent and comprehension intact. Soft, trembling voice. POSITIVE MYERSON'S SIGN.  Cranial Nerves: Pupils are equal and reactive to light. Visual fields are full to confrontation. Conjugate eye movements are full and symmetric. Facial sensation and strength are symmetric. Hearing is intact. Palate elevated symmetrically and uvula is midline. Shoulder shug is symmetric. Tongue is midline. SOFT HOARSE VOICE. Motor: Normal bulk and COGWHEEL RIGIDITY IN RUE >  LUE. REST TREMOR IN RUE > LUE, WITH MILD INTENTION TREMOR. MARKED BRADYKINESIA IN BUE AND BLE (RIGHT WORSE THAN LEFT). Full strength in the upper and lower extremities. No pronator drift.  Sensory: Intact and symmetric to light touch, temperature, vibration.  Coordination: MILD DYSMETRIA IN RUE.  Gait and Station: Narrow based gait; SHORT, SHUFFLING STEPS. MILDLY STOOPED POSTURE. POOR ARM SWING; RUE TREMOR. UNSTEADY.  Reflexes: Deep tendon reflexes in the UPPER EXT ARE TRACE; ABSENT IN LEGS.     DIAGNOSTIC DATA (LABS, IMAGING, TESTING) - I reviewed patient records, labs, notes, testing and imaging myself where available.  Lab Results  Component Value Date   WBC 6.4 10/17/2012   HGB 12.7 10/17/2012   HCT 38.0 10/17/2012   MCV 86.8 10/17/2012   PLT 199 10/17/2012      Component Value Date/Time   NA 137 10/18/2012 0603   K 3.5 10/18/2012 0603   CL 103 10/18/2012 0603   CO2 27 10/18/2012 0603   GLUCOSE 91 10/18/2012 0603   BUN 22 10/18/2012 0603   CREATININE 1.26* 10/18/2012 0603   CALCIUM 8.9 10/18/2012 0603   GFRNONAA 39* 10/18/2012 0603   GFRAA 45* 10/18/2012 0603   No results found for: CHOL No results found for: HGBA1C No results found for: VITAMINB12 Lab Results  Component Value Date   TSH 1.429 10/17/2012    03/24/10 CT head (without contrast) demonstrating: 1.  Incidentally noted bilateral ossicular reconstruction procedure.   2. Minimal chronic small vessel ischemic disease. 3. Compared to the prior CT head from 04/09/08, the previously noted right caudate ischemic infarction is no longer clearly seen.   ASSESSMENT AND PLAN  80 y.o. year old female here with hypertension, depression, with progressive tremor and gait difficulty since March 2010. Exam notable for right arm resting tremor, cogwheel rigidity, bradykinesia and postural instability. Has 4/4 cardinal signs of Parkinson's disease. Mild response to carb/levo. Increased tremor, anxiety, right shoulder pain. Significant psychosocial stress as well.  PLAN:  1. increase carbidopa/levodopa (25/100) up to 2 tabs TID; may consider ropinirole at next visit to add on 2. use walker to prevent falls   Orders Placed This Encounter  Procedures  . For home use only DME 4 wheeled rolling walker with seat   Meds ordered this encounter  Medications  . carbidopa-levodopa (SINEMET IR) 25-100 MG per tablet    Sig: Take 2 tablets by mouth 3 (three) times daily.    Dispense:  180 tablet    Refill:  6   Return in about 3 months (around 04/14/2014).    Suanne MarkerVIKRAM R. Linkin Vizzini, MD 01/13/2014, 1:05 PM Certified in Neurology, Neurophysiology and Neuroimaging  [facility] [address], [address] [place], [place] [address] [phone]

## 2014-01-13 NOTE — Patient Instructions (Signed)
Increase carbidopa/levodopa to 2 tabs three times per day

## 2014-04-16 ENCOUNTER — Encounter: Payer: Self-pay | Admitting: Diagnostic Neuroimaging

## 2014-04-16 ENCOUNTER — Ambulatory Visit (INDEPENDENT_AMBULATORY_CARE_PROVIDER_SITE_OTHER): Payer: Medicare Other | Admitting: Diagnostic Neuroimaging

## 2014-04-16 VITALS — BP 130/78 | HR 74 | Ht 60.0 in | Wt 127.0 lb

## 2014-04-16 DIAGNOSIS — G20 Parkinson's disease: Secondary | ICD-10-CM

## 2014-04-16 NOTE — Progress Notes (Signed)
[facility]  PATIENT: Nina Wade DOB: 01-12-1930  REFERRING CLINICIAN:  HISTORY FROM: patient  REASON FOR VISIT: follow up   HISTORICAL  CHIEF COMPLAINT:  Chief Complaint  Patient presents with  . Follow-up    3 month FU on Parkinson's. pt in room 7    HISTORY OF PRESENT ILLNESS:   UPDATE 04/16/14 (VRP): Since last visit, patient is stable. She and family are looking for placement for her elder son with cerebral palsy and feeding tube. Patient getting help from family (son pays bills, sets meds in pill box, takes care of finances).   UPDATE 01/13/14: Since last visit, did not increase carb/levo. Today here with son. Last fall 3 months ago. Not using walker. Sometimes uses a cane, but carries it without placing on the ground. Tremors are worse.  UPDATE 07/09/13: Since last visit, not doing well. Did not increase her carb/levo as instructed. Not paying attention to on/off fluctuations or wearing off. She is under a lot of stress. Having to take care of her adult son with cerebral palsy (652yrs old), and husband with dementia. Her daughter and son in law live across the street and help a little bit. Getting [facility] help [facility]. Also c/o right shoulder pain, anxiety and nervousness.  UPDATE 01/06/13 (LL): 81 year old left-handed female, has been 6 months since last visit. She states there has been no real change, except tremor in right upper extremity is worse. She inquires if there is a medication to make it better. She does not see any change when taking Sinemet, but she is taking it three times a day. Her 79 year old daughter has been diagnosed with Parkinson's recently. No falls.   UPDATE 07/09/12 (VRP): 81 year old left-handed female, has been 6 months since last visit. She states that she feels like she is doing the same. No falls since her last visit. She is taking the carb/levo three times a day at 8, 12, and in the evening, but tells me she  doesn't feel any different when she takes it or can't tell that it makes a difference. She is experiencing constipation, and takes Miralax prn and estimates her BMs to be every other day. Her main complaint is fatigue. She says she is walking ok without the cane or walker. She does not think her tremors are any worse.   UPDATE 11/19/11 (VRP): Patient forgot to follow up. Has been 1.5 years since last visit/ [facility], she tells me that she has been taking only 1 tab of carb/levo per day (says this was written on the bottle). Many more falls lately. Using a 4 point cane; got a rolling walker with brakes last friday. Has tried PT, but now discharged.   UPDATE 04/19/10 (VRP): Doing about the same. Think her tremor slightly improved since starting carb/levo. Has been to PT and doing well. No falls. No side effects.   PRIOR HPI (01/17/10, VRP): 81 year old left-handed female with history of hypertension and depression presenting for evaluation of tremor since March 2010. Patient reports progressive right upper extremity tremor. She was initially evaluated [facility] by Dr. Conrad BurlingtonLefkowitz, who raised the possibility of Parkinson's disease but did not start treatment since her tremor was mild and improving. Now tremor is worse. No tremor in her left hand. She denies smell or taste problems, vivid dreams, but does have balance and walking problems. She recently fell down when power went out and she tripped in the dark. No family history of Parkinson's  disease. The patient thinks that her symptoms started after her multiple orthopedic surgeries which were complicated by infection.    REVIEW OF SYSTEMS: Full 14 system review of systems performed and notable only for weakness incont bladder aching muscles cramps depression constipation appetite change hearing loss.   ALLERGIES: No Known Allergies  HOME MEDICATIONS: Outpatient Prescriptions Prior to Visit  Medication Sig Dispense Refill  . Calcium Carbonate Antacid (TUMS E-X PO)  Take 1 tablet by mouth daily.    . carbidopa-levodopa (SINEMET IR) 25-100 MG per tablet Take 2 tablets by mouth 3 (three) times daily. 180 tablet 6  . ibuprofen (ADVIL,MOTRIN) 200 MG tablet Take 200 mg by mouth every 6 (six) hours as needed.    Marland Kitchen oxybutynin (DITROPAN-XL) 10 MG 24 hr tablet Take 10 mg by mouth daily.    . polyethylene glycol powder (GLYCOLAX/MIRALAX) powder Take 17 g by mouth daily as needed (for constipation).     . valsartan-hydrochlorothiazide (DIOVAN-HCT) 160-12.5 MG per tablet Take 1 tablet by mouth daily.  0  . ciprofloxacin (CIPRO) 500 MG tablet Take 1 tablet (500 mg total) by mouth 2 (two) times daily. 8 tablet 0  . FLUoxetine (PROZAC) 20 MG capsule Take 20 mg by mouth daily.      No facility-administered medications prior to visit.    PAST MEDICAL HISTORY: Past Medical History  Diagnosis Date  . Arthritis   . Hypertension   . Anxiety   . Interstitial cystitis   . B12 deficiency   . Tremor   . CVA (cerebral infarction)   . Osteomyelitis   . Renal cyst   . Incontinence   . Esophageal reflux   . Anemia   . Depressive disorder   . Parkinson disease     PAST SURGICAL HISTORY: Past Surgical History  Procedure Laterality Date  . Total hip arthroplasty    . Total knee arthroplasty    . Leg surgery Right     x6  . Total vaginal hysterectomy      FAMILY HISTORY: Family History  Problem Relation Age of Onset  . Cancer Father   . Heart Problems Mother   . Cancer Brother     SOCIAL HISTORY:  History   Social History  . Marital Status: Single    Spouse Name: N/A  . Number of Children: 3  . Years of Education: HS   Occupational History  . Retired    Social History Main Topics  . Smoking status: Never Smoker   . Smokeless tobacco: Never Used  . Alcohol Use: No  . Drug Use: No  . Sexual Activity: No   Other Topics Concern  . Not on file   Social History Narrative   Pt lives at home alone, husband passed away in 11-Nov-2013.    Caffeine Use: Does not consume   Has someone that comes and helps her with home care      PHYSICAL EXAM  Filed Vitals:   04/16/14 0946  BP: 130/78  Pulse: 74  Height: 5' (1.524 m)  Weight: 127 lb (57.607 kg)    Not recorded      Body mass index is 24.8 kg/(m^2).  Generalized: Well developed, in no acute distress, well groomed.  Cardiac: Regular rate rhythm, no murmur   Neurological examination  Mental Status: Awake, alert. Language is fluent and comprehension intact. Soft, trembling voice. POSITIVE MYERSON'S SIGN.  Cranial Nerves: Pupils are equal and reactive to light. Visual fields are full to confrontation. Conjugate eye movements  are full and symmetric. Facial sensation and strength are symmetric. Hearing is intact. Palate elevated symmetrically and uvula is midline. Shoulder shug is symmetric. Tongue is midline. SOFT HOARSE VOICE. Motor: Normal bulk and COGWHEEL RIGIDITY IN RUE > LUE. REST TREMOR IN RUE > LUE, WITH MILD INTENTION TREMOR. MARKED BRADYKINESIA IN BUE AND BLE (RIGHT WORSE THAN LEFT). Full strength in the upper and lower extremities. No pronator drift.  Sensory: Intact and symmetric to light touch, temperature, vibration.  Coordination: MILD DYSMETRIA IN RUE.  Gait and Station: Narrow based gait; SHORT, SHUFFLING STEPS. MILDLY STOOPED POSTURE. POOR ARM SWING; RUE TREMOR. UNSTEADY. USES SINGLE POINT CANE. Reflexes: Deep tendon reflexes in the UPPER EXT ARE TRACE; ABSENT IN LEGS.     DIAGNOSTIC DATA (LABS, IMAGING, TESTING) - I reviewed patient records, labs, notes, testing and imaging myself where available.  Lab Results  Component Value Date   WBC 6.4 10/17/2012   HGB 12.7 10/17/2012   HCT 38.0 10/17/2012   MCV 86.8 10/17/2012   PLT 199 10/17/2012      Component Value Date/Time   NA 137 10/18/2012 0603   K 3.5 10/18/2012 0603   CL 103 10/18/2012 0603   CO2 27 10/18/2012 0603   GLUCOSE 91 10/18/2012 0603   BUN 22 10/18/2012 0603   CREATININE 1.26*  10/18/2012 0603   CALCIUM 8.9 10/18/2012 0603   GFRNONAA 39* 10/18/2012 0603   GFRAA 45* 10/18/2012 0603   No results found for: CHOL No results found for: HGBA1C No results found for: VITAMINB12 Lab Results  Component Value Date   TSH 1.429 10/17/2012    03/24/10 CT head (without contrast) demonstrating: 1. Incidentally noted bilateral ossicular reconstruction procedure.   2. Minimal chronic small vessel ischemic disease. 3. Compared to the prior CT head from 04/09/08, the previously noted right caudate ischemic infarction is no longer clearly seen.   ASSESSMENT AND PLAN  81 y.o. year old female here with hypertension, depression, with progressive tremor and gait difficulty since March 2010. Exam notable for right arm resting tremor, cogwheel rigidity, bradykinesia and postural instability. Has 4/4 cardinal signs of Parkinson's disease. Mild response to carb/levo. Increased tremor, anxiety, right shoulder pain. Significant psychosocial stress as well.  PLAN:  1. Continue carbidopa/levodopa (25/100) 2 tabs TID 2. use walker to prevent falls   Return in about 6 months (around 10/17/2014).    Suanne Marker, MD 04/16/2014, 11:08 AM Certified in Neurology, Neurophysiology and Neuroimaging  [facility] [address] [place], [place] [address] [phone]

## 2014-04-16 NOTE — Patient Instructions (Signed)
Continue current medications. 

## 2014-05-21 ENCOUNTER — Other Ambulatory Visit: Payer: Self-pay | Admitting: Family Medicine

## 2014-05-21 ENCOUNTER — Ambulatory Visit
Admission: RE | Admit: 2014-05-21 | Discharge: 2014-05-21 | Disposition: A | Discharge status: Home / Self Care | Payer: Medicare Other | Source: Ambulatory Visit | Attending: Family Medicine | Admitting: Family Medicine

## 2014-05-21 DIAGNOSIS — R079 Chest pain, unspecified: Secondary | ICD-10-CM

## 2014-09-22 ENCOUNTER — Other Ambulatory Visit (HOSPITAL_BASED_OUTPATIENT_CLINIC_OR_DEPARTMENT_OTHER): Payer: Self-pay | Admitting: Family Medicine

## 2014-09-22 ENCOUNTER — Other Ambulatory Visit (HOSPITAL_BASED_OUTPATIENT_CLINIC_OR_DEPARTMENT_OTHER): Payer: Medicare Other

## 2014-09-22 DIAGNOSIS — S0990XA Unspecified injury of head, initial encounter: Secondary | ICD-10-CM

## 2014-09-23 ENCOUNTER — Ambulatory Visit (INDEPENDENT_AMBULATORY_CARE_PROVIDER_SITE_OTHER): Payer: Medicare Other

## 2014-09-23 DIAGNOSIS — R51 Headache: Secondary | ICD-10-CM

## 2014-09-23 DIAGNOSIS — R112 Nausea with vomiting, unspecified: Secondary | ICD-10-CM | POA: Diagnosis not present

## 2014-09-23 DIAGNOSIS — S0990XA Unspecified injury of head, initial encounter: Secondary | ICD-10-CM

## 2014-09-23 DIAGNOSIS — R42 Dizziness and giddiness: Secondary | ICD-10-CM

## 2014-10-01 ENCOUNTER — Telehealth: Payer: Self-pay | Admitting: Diagnostic Neuroimaging

## 2014-10-01 NOTE — Telephone Encounter (Signed)
Patient called, she is not feeling well, feels sick. Wonders if the medication is what's making her sick.

## 2014-10-01 NOTE — Telephone Encounter (Signed)
Spoke with patient who states she has had nausea x 1 week , vomiting x past few days. She denies diarrhea, fever, other problems, issues. She denies new medications, and this RN verified her current dose of Carbidopa Levodopa. She is taking it as prescribed. Advised she call Dr Carlota Raspberry, PCP and discuss with nurse, suggested her n/v is not likely being caused by Carbidopa Levodopa because she has been on this medication at this dose since Dec 2015. She verbalized understanding, agreement to above plan.

## 2014-10-19 ENCOUNTER — Encounter: Payer: Self-pay | Admitting: Diagnostic Neuroimaging

## 2014-10-19 ENCOUNTER — Ambulatory Visit (INDEPENDENT_AMBULATORY_CARE_PROVIDER_SITE_OTHER): Payer: Medicare Other | Admitting: Diagnostic Neuroimaging

## 2014-10-19 VITALS — BP 115/69 | HR 93 | Ht 60.0 in | Wt 117.6 lb

## 2014-10-19 DIAGNOSIS — G20 Parkinson's disease: Secondary | ICD-10-CM

## 2014-10-19 MED ORDER — CARBIDOPA-LEVODOPA 25-100 MG PO TABS
2.0000 | ORAL_TABLET | Freq: Three times a day (TID) | ORAL | Status: AC
Start: 2014-10-19 — End: ?

## 2014-10-19 NOTE — Progress Notes (Signed)
[facility]  PATIENT: Nina Wade DOB: 01-29-1930  REFERRING CLINICIAN:  HISTORY FROM: patient  REASON FOR VISIT: follow up   HISTORICAL  CHIEF COMPLAINT:  Chief Complaint  Patient presents with  . parkinson's disease    rm 7, son - Truett Perna  . Follow-up    6 month    HISTORY OF PRESENT ILLNESS:   UPDATE 10/19/14: Since last visit, had 2 falls. Tremors increasing. Family helps with shopping, pills. Patient has someone come in to clean. No driving. Still living alone.   UPDATE 04/16/14 (VRP): Since last visit, patient is stable. She and family are looking for placement for her elder son with cerebral palsy and feeding tube. Patient getting help from family (son pays bills, sets meds in pill box, takes care of finances).   UPDATE 01/13/14: Since last visit, did not increase carb/levo. Today here with son. Last fall 3 months ago. Not using walker. Sometimes uses a cane, but carries it without placing on the ground. Tremors are worse.  UPDATE 07/09/13: Since last visit, not doing well. Did not increase her carb/levo as instructed. Not paying attention to on/off fluctuations or wearing off. She is under a lot of stress. Having to take care of her adult son with cerebral palsy (45yrs old), and husband with dementia. Her daughter and son in law live across the street and help a little bit. Getting some help from [facility]. Also c/o right shoulder pain, anxiety and nervousness.  UPDATE 01/06/13 (LL): 81 year old left-handed female, has been 6 months since last visit. She states there has been no real change, except tremor in right upper extremity is worse. She inquires if there is a medication to make it better. She does not see any change when taking Sinemet, but she is taking it three times a day. Her 84 year old daughter has been diagnosed with Parkinson's recently. No falls.   UPDATE 07/09/12 (VRP): 81 year old left-handed female, has been 6 months since  last visit. She states that she feels like she is doing the same. No falls since her last visit. She is taking the carb/levo three times a day at 8, 12, and in the evening, but tells me she doesn't feel any different when she takes it or can't tell that it makes a difference. She is experiencing constipation, and takes Miralax prn and estimates her BMs to be every other day. Her main complaint is fatigue. She says she is walking ok without the cane or walker. She does not think her tremors are any worse.   UPDATE 11/19/11 (VRP): Patient forgot to follow up. Has been 1.5 years since last visit/ [place], she tells me that she has been taking only 1 tab of carb/levo per day (says this was written on the bottle). Many more falls lately. Using a 4 point cane; got a rolling walker with brakes last friday. Has tried PT, but now discharged.   UPDATE 04/19/10 (VRP): Doing about the same. Think her tremor slightly improved since starting carb/levo. Has been to PT and doing well. No falls. No side effects.   PRIOR HPI (01/17/10, VRP): 81 year old left-handed female with history of hypertension and depression presenting for evaluation of tremor since March 2010. Patient reports progressive right upper extremity tremor. She was initially evaluated [facility] by Dr. Conrad [place], who raised the possibility of Parkinson's disease but did not start treatment since her tremor was mild and improving. Now tremor is worse. No tremor in her left  hand. She denies smell or taste problems, vivid dreams, but does have balance and walking problems. She recently fell down when power went out and she tripped in the dark. No family history of Parkinson's disease. The patient thinks that her symptoms started after her multiple orthopedic surgeries which were complicated by infection.    REVIEW OF SYSTEMS: Full 14 system review of systems performed and notable only as per HPI.   ALLERGIES: No Known Allergies  HOME MEDICATIONS: Outpatient  Prescriptions Prior to Visit  Medication Sig Dispense Refill  . Calcium Carbonate Antacid (TUMS E-X PO) Take 1 tablet by mouth daily.    Marland Kitchen FLUoxetine (PROZAC) 40 MG capsule   5  . ibuprofen (ADVIL,MOTRIN) 200 MG tablet Take 200 mg by mouth every 6 (six) hours as needed.    . polyethylene glycol powder (GLYCOLAX/MIRALAX) powder Take 17 g by mouth daily as needed (for constipation).     . valsartan-hydrochlorothiazide (DIOVAN-HCT) 160-12.5 MG per tablet Take 1 tablet by mouth daily.  0  . carbidopa-levodopa (SINEMET IR) 25-100 MG per tablet Take 2 tablets by mouth 3 (three) times daily. 180 tablet 6  . oxybutynin (DITROPAN-XL) 10 MG 24 hr tablet Take 10 mg by mouth daily.     No facility-administered medications prior to visit.    PAST MEDICAL HISTORY: Past Medical History  Diagnosis Date  . Arthritis   . Hypertension   . Anxiety   . Interstitial cystitis   . B12 deficiency   . Tremor   . CVA (cerebral infarction)   . Osteomyelitis   . Renal cyst   . Incontinence   . Esophageal reflux   . Anemia   . Depressive disorder   . Parkinson disease     PAST SURGICAL HISTORY: Past Surgical History  Procedure Laterality Date  . Total hip arthroplasty    . Total knee arthroplasty    . Leg surgery Right     x6  . Total vaginal hysterectomy      FAMILY HISTORY: Family History  Problem Relation Age of Onset  . Cancer Father   . Heart Problems Mother   . Cancer Brother     SOCIAL HISTORY:  Social History   Social History  . Marital Status: Single    Spouse Name: N/A  . Number of Children: 3  . Years of Education: HS   Occupational History  . Retired    Social History Main Topics  . Smoking status: Never Smoker   . Smokeless tobacco: Never Used  . Alcohol Use: No  . Drug Use: No  . Sexual Activity: No   Other Topics Concern  . Not on file   Social History Narrative   Pt lives at home alone, husband passed away in 10/26/2013.   Caffeine Use: Does not  consume   Has someone that comes and helps her with home care      PHYSICAL EXAM  Filed Vitals:   10/19/14 0949  BP: 115/69  Pulse: 93  Height: 5' (1.524 m)  Weight: 117 lb 9.6 oz (53.343 kg)    Not recorded      Body mass index is 22.97 kg/(m^2).  Generalized: Well developed, in no acute distress, well groomed.  Cardiac: Regular rate rhythm, no murmur   Neurological examination  Mental Status: Awake, alert. Language is fluent and comprehension intact. Soft, trembling voice. POSITIVE MYERSON'S SIGN.  Cranial Nerves: Pupils are equal and reactive to light. Visual fields are full to confrontation. Conjugate eye movements  are full and symmetric. Facial sensation and strength are symmetric. Hearing is intact. Palate elevated symmetrically and uvula is midline. Shoulder shug is symmetric. Tongue is midline. SOFT HOARSE VOICE. Motor: Normal bulk and COGWHEEL RIGIDITY IN RUE > LUE. REST TREMOR IN RUE > LUE, WITH MILD INTENTION TREMOR. MARKED BRADYKINESIA IN BUE AND BLE (RIGHT WORSE THAN LEFT). Full strength in the upper and lower extremities. No pronator drift.  Sensory: Intact and symmetric to light touch, temperature, vibration.  Coordination: MILD DYSMETRIA IN RUE.  Gait and Station: Narrow based gait; SHORT, SHUFFLING STEPS. MILDLY STOOPED POSTURE. POOR ARM SWING; RUE TREMOR. UNSTEADY. USES SINGLE POINT CANE. Reflexes: Deep tendon reflexes in the UPPER EXT ARE TRACE; ABSENT IN LEGS.     DIAGNOSTIC DATA (LABS, IMAGING, TESTING) - I reviewed patient records, labs, notes, testing and imaging myself where available.  Lab Results  Component Value Date   WBC 6.4 10/17/2012   HGB 12.7 10/17/2012   HCT 38.0 10/17/2012   MCV 86.8 10/17/2012   PLT 199 10/17/2012      Component Value Date/Time   NA 137 10/18/2012 0603   K 3.5 10/18/2012 0603   CL 103 10/18/2012 0603   CO2 27 10/18/2012 0603   GLUCOSE 91 10/18/2012 0603   BUN 22 10/18/2012 0603   CREATININE 1.26* 10/18/2012  0603   CALCIUM 8.9 10/18/2012 0603   GFRNONAA 39* 10/18/2012 0603   GFRAA 45* 10/18/2012 0603   No results found for: CHOL No results found for: HGBA1C No results found for: VITAMINB12 Lab Results  Component Value Date   TSH 1.429 10/17/2012    03/24/10 CT head (without contrast) demonstrating: 1. Incidentally noted bilateral ossicular reconstruction procedure.   2. Minimal chronic small vessel ischemic disease. 3. Compared to the prior CT head from 04/09/08, the previously noted right caudate ischemic infarction is no longer clearly seen.   ASSESSMENT AND PLAN  81 y.o. year old female here with hypertension, depression, with progressive tremor and gait difficulty since March 2010. Exam notable for right arm resting tremor, cogwheel rigidity, bradykinesia and postural instability. Has 4/4 cardinal signs of Parkinson's disease. Mild response to carb/levo.   Dx: Parkinson's disease (tremor, stiffness, slow motion, unstable posture) - Plan: For home use only DME 4 wheeled rolling walker with seat    PLAN:  I spent 15 minutes of face to face time with patient. Greater than 50% of time was spent in counseling and coordination of care with patient. In summary we discussed:  1. Continue carbidopa/levodopa (25/100) 2 tabs TID 2. use walker to prevent falls   Return in about 1 year (around 10/19/2015).    Suanne Marker, MD 10/19/2014, 10:11 AM Certified in Neurology, Neurophysiology and Neuroimaging  [facility] [address], [address] [place], [place] [address] [phone]

## 2015-10-19 ENCOUNTER — Ambulatory Visit: Payer: Medicare Other | Admitting: Diagnostic Neuroimaging

## 2015-10-20 ENCOUNTER — Encounter: Payer: Self-pay | Admitting: Diagnostic Neuroimaging

## 2015-12-01 ENCOUNTER — Telehealth: Payer: Self-pay | Admitting: Diagnostic Neuroimaging

## 2015-12-01 ENCOUNTER — Telehealth: Payer: Self-pay | Admitting: Neurology

## 2015-12-01 NOTE — Telephone Encounter (Signed)
Pt's son called to advise he will be bringing her to her appt. He works 3rd shift and does not get off until 7:30 am. Can she have 11/3 @ 10:00 work-in time. Please call

## 2015-12-01 NOTE — Telephone Encounter (Signed)
error 

## 2015-12-01 NOTE — Telephone Encounter (Signed)
Spoke with patient's son who provides her with transportation to follow ups. At his request, scheduled her for Nov 10th; requested thy arrive 15 min early for check in. He verbalized understanding, appreciation.

## 2015-12-06 ENCOUNTER — Ambulatory Visit: Payer: Medicare Other | Admitting: Diagnostic Neuroimaging

## 2015-12-20 ENCOUNTER — Ambulatory Visit: Payer: Medicare Other | Admitting: Diagnostic Neuroimaging

## 2015-12-23 ENCOUNTER — Ambulatory Visit (INDEPENDENT_AMBULATORY_CARE_PROVIDER_SITE_OTHER): Payer: Medicare Other | Admitting: Diagnostic Neuroimaging

## 2015-12-23 ENCOUNTER — Encounter: Payer: Self-pay | Admitting: Diagnostic Neuroimaging

## 2015-12-23 VITALS — BP 132/87 | HR 81 | Ht 60.0 in | Wt 128.8 lb

## 2015-12-23 DIAGNOSIS — R269 Unspecified abnormalities of gait and mobility: Secondary | ICD-10-CM

## 2015-12-23 DIAGNOSIS — G20 Parkinson's disease: Secondary | ICD-10-CM

## 2015-12-23 NOTE — Progress Notes (Signed)
[facility]  PATIENT: Nina RacerClarice T Wade DOB: 03-11-1929  REFERRING CLINICIAN:  HISTORY FROM: patient  REASON FOR VISIT: follow up   HISTORICAL  CHIEF COMPLAINT:  Chief Complaint  Patient presents with  . Rm 6    Son, [place]  . Follow-up    1 yr Parkinson's disease f/u   . Gait Problem    Pt continues to have falls w/ mulitple ED visits since last year. Ambulating w/ walker. Walking to the left this morning. Currently resides at ALF.  . Tremors    Feels that tremors have been stable. Does have involuntary movements of left shoulder. Continues carbadopa-levodopa 3x/day.     HISTORY OF PRESENT ILLNESS:   UPDATE 12/23/15: Since last visit, now living in assisted living. Continue to have falls, mainly when she doesn't use her walker. Now trying to use it more.   UPDATE 10/19/14: Since last visit, had 2 falls. Tremors increasing. Family helps with shopping, pills. Patient has someone come in to clean. No driving. Still living alone.   UPDATE 04/16/14 (VRP): Since last visit, patient is stable. She and family are looking for placement for her elder son with cerebral palsy and feeding tube. Patient getting help from family (son pays bills, sets meds in pill box, takes care of finances).   UPDATE 01/13/14: Since last visit, did not increase carb/levo. Today here with son. Last fall 3 months ago. Not using walker. Sometimes uses a cane, but carries it without placing on the ground. Tremors are worse.  UPDATE 07/09/13: Since last visit, not doing well. Did not increase her carb/levo as instructed. Not paying attention to on/off fluctuations or wearing off. She is under a lot of stress. Having to take care of her adult son with cerebral palsy (281yrs old), and husband with dementia. Her daughter and son in law live across the street and help a little bit. Getting some help from [facility]. Also c/o right shoulder pain, anxiety and nervousness.  UPDATE 01/06/13  (LL): 82 year old left-handed female, has been 6 months since last visit. She states there has been no real change, except tremor in right upper extremity is worse. She inquires if there is a medication to make it better. She does not see any change when taking Sinemet, but she is taking it three times a day. Her 80 year old daughter has been diagnosed with Parkinson's recently. No falls.   UPDATE 07/09/12 (VRP): 82 year old left-handed female, has been 6 months since last visit. She states that she feels like she is doing the same. No falls since her last visit. She is taking the carb/levo three times a day at 8, 12, and in the evening, but tells me she doesn't feel any different when she takes it or can't tell that it makes a difference. She is experiencing constipation, and takes Miralax prn and estimates her BMs to be every other day. Her main complaint is fatigue. She says she is walking ok without the cane or walker. She does not think her tremors are any worse.   UPDATE 11/19/11 (VRP): Patient forgot to follow up. Has been 1.5 years since last visit/ [facility], she tells me that she has been taking only 1 tab of carb/levo per day (says this was written on the bottle). Many more falls lately. Using a 4 point cane; got a rolling walker with brakes last friday. Has tried PT, but now discharged.   UPDATE 04/19/10 (VRP): Doing about the same. Think  her tremor slightly improved since starting carb/levo. Has been to PT and doing well. No falls. No side effects.   PRIOR HPI (01/17/10, VRP): 82 year old left-handed female with history of hypertension and depression presenting for evaluation of tremor since March 2010. Patient reports progressive right upper extremity tremor. She was initially evaluated [facility] by Dr. Conrad [place], who raised the possibility of Parkinson's disease but did not start treatment since her tremor was mild and improving. Now tremor is worse. No tremor in her left hand. She denies smell or taste  problems, vivid dreams, but does have balance and walking problems. She recently fell down when power went out and she tripped in the dark. No family history of Parkinson's disease. The patient thinks that her symptoms started after her multiple orthopedic surgeries which were complicated by infection.    REVIEW OF SYSTEMS: Full 14 system review of systems performed and negative except for: memory loss, joint pain, aching muscles.    ALLERGIES: Allergies  Allergen Reactions  . Famotidine     Other reaction(s): Unknown  . Sulfamethoxazole-Trimethoprim     Other reaction(s): Unknown    HOME MEDICATIONS: Outpatient Medications Prior to Visit  Medication Sig Dispense Refill  . Calcium Carbonate Antacid (TUMS E-X PO) Take 1 tablet by mouth daily.    . carbidopa-levodopa (SINEMET IR) 25-100 MG per tablet Take 2 tablets by mouth 3 (three) times daily. 180 tablet 12  . FLUoxetine (PROZAC) 40 MG capsule   5  . ibuprofen (ADVIL,MOTRIN) 200 MG tablet Take 200 mg by mouth every 6 (six) hours as needed.    Marland Kitchen oxybutynin (DITROPAN-XL) 10 MG 24 hr tablet Take 10 mg by mouth daily.    . polyethylene glycol powder (GLYCOLAX/MIRALAX) powder Take 17 g by mouth daily as needed (for constipation).     . valsartan-hydrochlorothiazide (DIOVAN-HCT) 160-12.5 MG per tablet Take 1 tablet by mouth daily.  0   No facility-administered medications prior to visit.     PAST MEDICAL HISTORY: Past Medical History:  Diagnosis Date  . Anemia   . Anxiety   . Arthritis   . B12 deficiency   . CVA (cerebral infarction)   . Depressive disorder   . Esophageal reflux   . Hypertension   . Incontinence   . Interstitial cystitis   . Osteomyelitis (HCC)   . Parkinson disease (HCC)   . Renal cyst   . Tremor     PAST SURGICAL HISTORY: Past Surgical History:  Procedure Laterality Date  . LEG SURGERY Right    x6  . TOTAL HIP ARTHROPLASTY    . TOTAL KNEE ARTHROPLASTY    . TOTAL VAGINAL HYSTERECTOMY      FAMILY  HISTORY: Family History  Problem Relation Age of Onset  . Cancer Father   . Heart Problems Mother   . Cancer Brother     SOCIAL HISTORY:  Social History   Social History  . Marital status: Single    Spouse name: N/A  . Number of children: 3  . Years of education: HS   Occupational History  . Retired    Social History Main Topics  . Smoking status: Never Smoker  . Smokeless tobacco: Never Used  . Alcohol use No  . Drug use: No  . Sexual activity: No   Other Topics Concern  . Not on file   Social History Narrative   Pt lives at assisted living, husband passed away in 11-12-2013.   Caffeine Use: Does not consume   Has someone  that comes and helps her with home care    Left-handed     PHYSICAL EXAM  Vitals:   12/23/15 0910  BP: 132/87  Pulse: 81  Weight: 128 lb 12.8 oz (58.4 kg)  Height: 5' (1.524 m)    Not recorded      Body mass index is 25.15 kg/m.  Generalized: Well developed, in no acute distress, well groomed.  Cardiac: Regular rate rhythm, no murmur   Neurological examination  Mental Status: Awake, alert. Language is fluent and comprehension intact. Soft, trembling voice. POSITIVE MYERSON'S SIGN.  Cranial Nerves: Pupils are equal and reactive to light. Visual fields are full to confrontation. Conjugate eye movements are full and symmetric. Facial sensation and strength are symmetric. Hearing is intact. Palate elevated symmetrically and uvula is midline. Shoulder shug is symmetric. Tongue is midline. SOFT HOARSE VOICE. Motor: LEFT SHOULDER DYSKINESIA / SHRUGGING MOVEMENTS; normal bulk and COGWHEEL RIGIDITY IN RUE > LUE. MILD REST TREMOR IN LUE > RUE; MILD BRADYKINESIA IN BUE AND BLE (RIGHT WORSE THAN LEFT). Full strength in the upper and lower extremities. No pronator drift.  Sensory: Intact and symmetric to light touch, temperature, vibration.  Coordination: MILD DYSMETRIA IN RUE.  Gait and Station: Narrow based gait; SMOOTH STRIDE; STOOPED  POSTURE. USES WALKER Reflexes: Deep tendon reflexes in the UPPER EXT ARE TRACE; ABSENT IN LEGS.     DIAGNOSTIC DATA (LABS, IMAGING, TESTING) - I reviewed patient records, labs, notes, testing and imaging myself where available.  Lab Results  Component Value Date   WBC 6.4 10/17/2012   HGB 12.7 10/17/2012   HCT 38.0 10/17/2012   MCV 86.8 10/17/2012   PLT 199 10/17/2012      Component Value Date/Time   NA 137 10/18/2012 0603   K 3.5 10/18/2012 0603   CL 103 10/18/2012 0603   CO2 27 10/18/2012 0603   GLUCOSE 91 10/18/2012 0603   BUN 22 10/18/2012 0603   CREATININE 1.26 (H) 10/18/2012 0603   CALCIUM 8.9 10/18/2012 0603   GFRNONAA 39 (L) 10/18/2012 0603   GFRAA 45 (L) 10/18/2012 0603   No results found for: CHOL No results found for: HGBA1C No results found for: VITAMINB12 Lab Results  Component Value Date   TSH 1.429 10/17/2012    03/24/10 CT head (without contrast) demonstrating: 1. Incidentally noted bilateral ossicular reconstruction procedure.   2. Minimal chronic small vessel ischemic disease. 3. Compared to the prior CT head from 04/09/08, the previously noted right caudate ischemic infarction is no longer clearly seen.   ASSESSMENT AND PLAN  82 y.o. year old female here with hypertension, depression, with progressive tremor and gait difficulty since March 2010. Exam notable for right arm resting tremor, cogwheel rigidity, bradykinesia and postural instability. Has 4/4 cardinal signs of Parkinson's disease. Mild response to carb/levo.    Dx:  Parkinson's disease (tremor, stiffness, slow motion, unstable posture) (HCC)  Gait difficulty    PLAN:  I spent 15 minutes of face to face time with patient. Greater than 50% of time was spent in counseling and coordination of care with patient. In summary we discussed:  - continue carbidopa/levodopa (25/100) 2 tabs TID - use walker to prevent falls  - PT at assisted living  Return in about 1 year (around  12/22/2016).    Suanne MarkerVIKRAM R. Laymond Postle, MD 12/23/2015, 9:25 AM Certified in Neurology, Neurophysiology and Neuroimaging  [facility] [address], [address] [place], [place] [address] [phone]

## 2015-12-29 ENCOUNTER — Other Ambulatory Visit: Payer: Self-pay | Admitting: Orthopedic Surgery

## 2015-12-29 ENCOUNTER — Encounter (INDEPENDENT_AMBULATORY_CARE_PROVIDER_SITE_OTHER): Payer: Medicare Other

## 2015-12-29 ENCOUNTER — Ambulatory Visit (INDEPENDENT_AMBULATORY_CARE_PROVIDER_SITE_OTHER): Payer: Medicare Other

## 2015-12-29 DIAGNOSIS — M25552 Pain in left hip: Secondary | ICD-10-CM

## 2016-03-06 IMAGING — CT CT MAXILLOFACIAL W/O CM
4 of 7 series · 11 of 30 positions shown, 12 images · non-contrast
Comparison: CT scan of the head dated 03/24/2010

CLINICAL DATA: Headache, dizziness, face pain and nausea and
vomiting since the patient fell on her face on 09/16/2014.

EXAM:
CT HEAD WITHOUT CONTRAST
CT MAXILLOFACIAL WITHOUT CONTRAST
TECHNIQUE: Multidetector CT imaging of the head and maxillofacial structures
were performed using the standard protocol without intravenous
contrast. Multiplanar CT image reconstructions of the maxillofacial
structures were also generated.

[Series 3: recon 2: brain wo · axial · 0.41mm/px · z∈[-73,+13]mm · 3 of 64 slices shown, 4 images]
[im 16/64  brain]
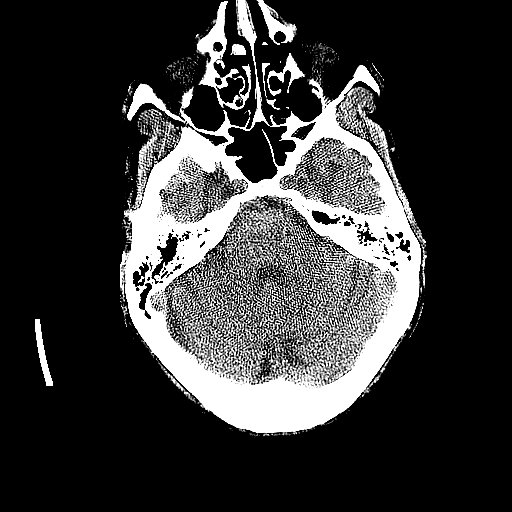
[im 16/64  bone]
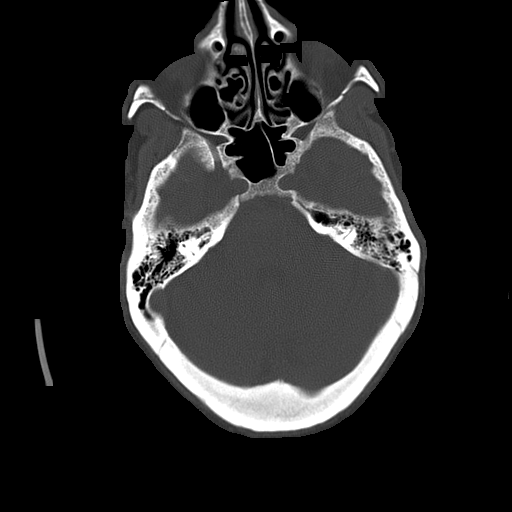
[im 32/64  bone]
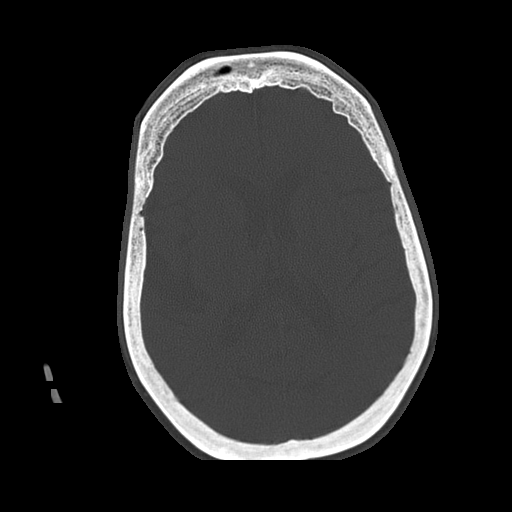
[im 48/64  bone]
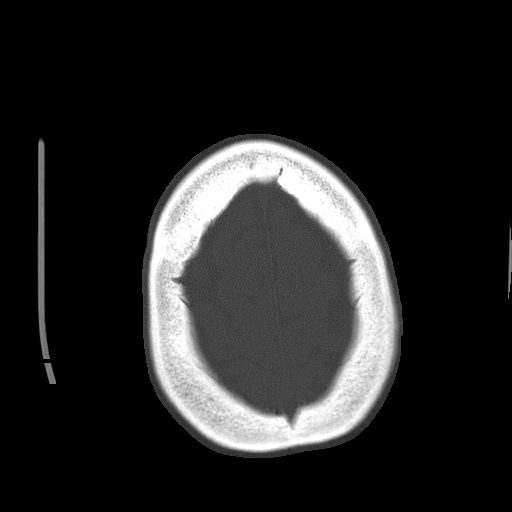

[Series 600: sag 1 · sagittal · 0.35mm/px · 3 of 69 slices shown]
[im 18/69  bone]
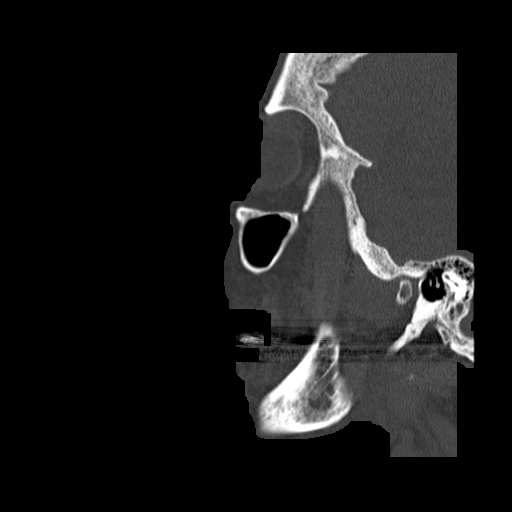
[im 35/69  bone]
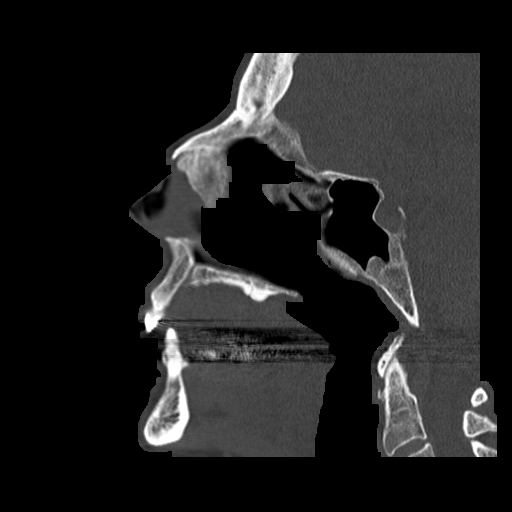
[im 52/69  bone]
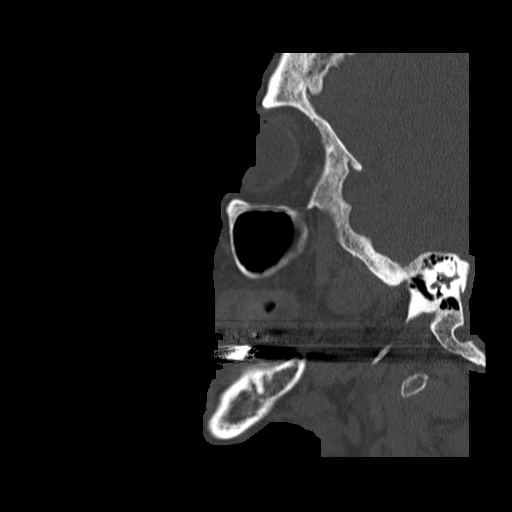

[Series 601: sag 2 · sagittal · 0.35mm/px · 3 of 69 slices shown]
[im 18/69  bone]
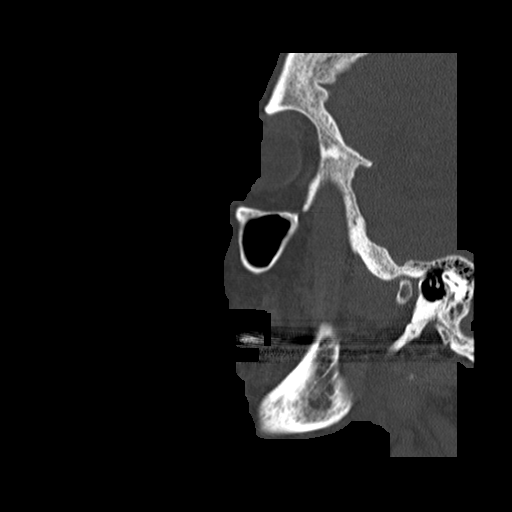
[im 35/69  bone]
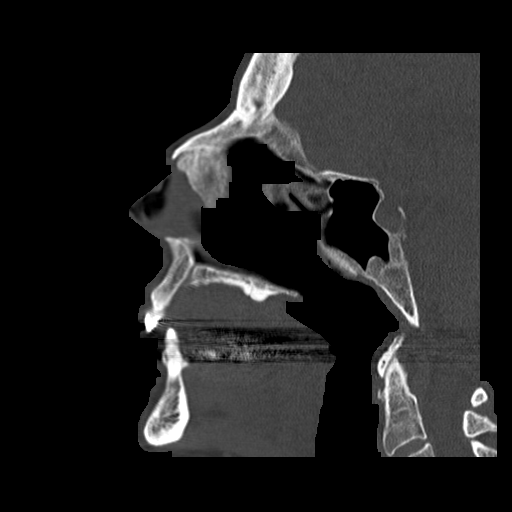
[im 52/69  bone]
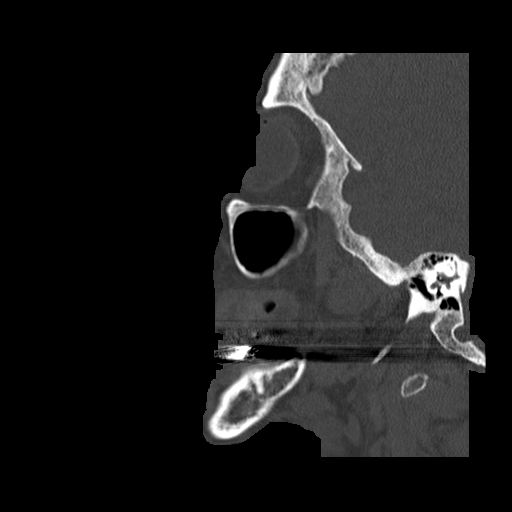

[Series 602: cor 1 · coronal · 0.35mm/px · 2 of 60 slices shown]
[im 20/60  bone]
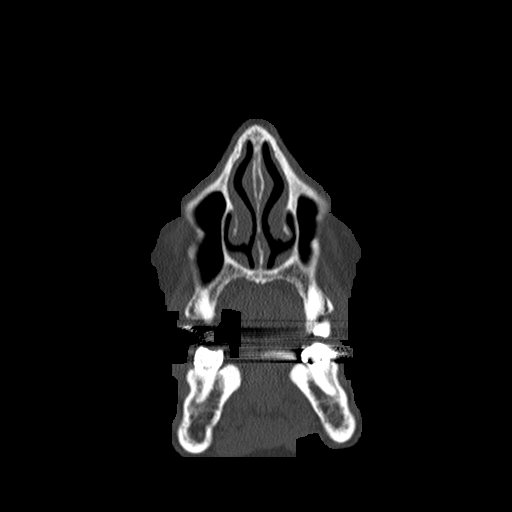
[im 40/60  bone]
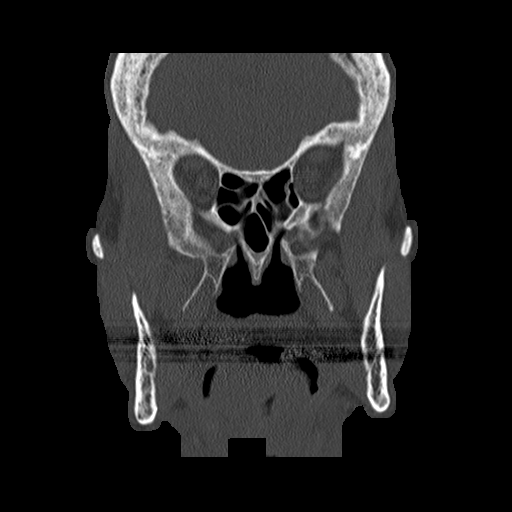

[11 of 30 positions shown; findings below may reference images not displayed]

FINDINGS: CT HEAD FINDINGS

There is no acute intracranial hemorrhage, acute infarction, or
intracranial mass lesion. There is slight diffuse atrophy. Minimal
periventricular white matter lucency. Tiny old infarct in the body
of the right caudate. No osseous abnormality.

CT MAXILLOFACIAL FINDINGS

The facial bones are intact. There is fairly severe arthritis of the
left temporomandibular joint. The left mandibular condyle is
flattened with osteophytes and subcortical cysts. Right mandibular
condyle is normal.

There is no sinus opacification.  Orbits are normal.
IMPRESSION: 1. No acute intracranial abnormality. Old tiny right caudate body
lacunar infarct. Minimal chronic small vessel ischemic disease.
2. No acute abnormality of the maxillofacial structures. Arthritis
of the left temporomandibular joint.

## 2016-12-21 ENCOUNTER — Ambulatory Visit: Payer: Medicare Other | Admitting: Diagnostic Neuroimaging

## 2016-12-21 ENCOUNTER — Encounter: Payer: Self-pay | Admitting: Diagnostic Neuroimaging

## 2016-12-21 ENCOUNTER — Encounter (INDEPENDENT_AMBULATORY_CARE_PROVIDER_SITE_OTHER): Payer: Self-pay

## 2016-12-21 VITALS — BP 149/84 | HR 85 | Ht 60.0 in | Wt 126.2 lb

## 2016-12-21 DIAGNOSIS — R269 Unspecified abnormalities of gait and mobility: Secondary | ICD-10-CM

## 2016-12-21 DIAGNOSIS — G20 Parkinson's disease: Secondary | ICD-10-CM

## 2016-12-21 NOTE — Progress Notes (Signed)
[facility]  PATIENT: Nina Wade DOB: 10/06/1929  REFERRING CLINICIAN:  HISTORY FROM: patient  REASON FOR VISIT: follow up   HISTORICAL  CHIEF COMPLAINT:  Chief Complaint  Patient presents with  . Follow-up  . PD    Pt states she is stable.  Has had 2 falls (one hurt head )[facility], the other did not hurt herself.  (resides at [facility]. )    HISTORY OF PRESENT ILLNESS:   UPDATE (12/21/16, VRP): Since last visit, doing about the same. Has continued to have some falls (withou walker). Tolerating meds. No alleviating or aggravating factors.   UPDATE 12/23/15: Since last visit, now living in assisted living. Continue to have falls, mainly when she doesn't use her walker. Now trying to use it more.   UPDATE 10/19/14: Since last visit, had 2 falls. Tremors increasing. Family helps with shopping, pills. Patient has someone come in to clean. No driving. Still living alone.   UPDATE 04/16/14 (VRP): Since last visit, patient is stable. She and family are looking for placement for her elder son with cerebral palsy and feeding tube. Patient getting help from family (son pays bills, sets meds in pill box, takes care of finances).   UPDATE 01/13/14: Since last visit, did not increase carb/levo. Today here with son. Last fall 3 months ago. Not using walker. Sometimes uses a cane, but carries it without placing on the ground. Tremors are worse.  UPDATE 07/09/13: Since last visit, not doing well. Did not increase her carb/levo as instructed. Not paying attention to on/off fluctuations or wearing off. She is under a lot of stress. Having to take care of her adult son with cerebral palsy (81yrs old), and husband with dementia. Her daughter and son in law live across the street and help a little bit. Getting some help from [facility]. Also c/o right shoulder pain, anxiety and nervousness.  UPDATE 01/06/13 (LL): 83 year old left-handed female, has been 6 months  since last visit. She states there has been no real change, except tremor in right upper extremity is worse. She inquires if there is a medication to make it better. She does not see any change when taking Sinemet, but she is taking it three times a day. Her 81 year old daughter has been diagnosed with Parkinson's recently. No falls.   UPDATE 07/09/12 (VRP): 83 year old left-handed female, has been 6 months since last visit. She states that she feels like she is doing the same. No falls since her last visit. She is taking the carb/levo three times a day at 8, 12, and in the evening, but tells me she doesn't feel any different when she takes it or can't tell that it makes a difference. She is experiencing constipation, and takes Miralax prn and estimates her BMs to be every other day. Her main complaint is fatigue. She says she is walking ok without the cane or walker. She does not think her tremors are any worse.   UPDATE 11/19/11 (VRP): Patient forgot to follow up. Has been 1.5 years since last visit/ [facility], she tells me that she has been taking only 1 tab of carb/levo per day (says this was written on the bottle). Many more falls lately. Using a 4 point cane; got a rolling walker with brakes last friday. Has tried PT, but now discharged.   UPDATE 04/19/10 (VRP): Doing about the same. Think her tremor slightly improved since starting carb/levo. Has been to PT and doing  well. No falls. No side effects.   PRIOR HPI (01/17/10, VRP): 83 year old left-handed female with history of hypertension and depression presenting for evaluation of tremor since March 2010. Patient reports progressive right upper extremity tremor. She was initially evaluated [facility] by Dr. Conrad BurlingtonLefkowitz, who raised the possibility of Parkinson's disease but did not start treatment since her tremor was mild and improving. Now tremor is worse. No tremor in her left hand. She denies smell or taste problems, vivid dreams, but does have balance and walking  problems. She recently fell down when power went out and she tripped in the dark. No family history of Parkinson's disease. The patient thinks that her symptoms started after her multiple orthopedic surgeries which were complicated by infection.    REVIEW OF SYSTEMS: Full 14 system review of systems performed and negative except for: memory loss, hearing loss, joint pain.    ALLERGIES: Allergies  Allergen Reactions  . Famotidine     Other reaction(s): Unknown  . Sulfamethoxazole-Trimethoprim     Other reaction(s): Unknown    HOME MEDICATIONS: Outpatient Medications Prior to Visit  Medication Sig Dispense Refill  . acetaminophen (TYLENOL) 325 MG tablet Take 650 mg every 6 (six) hours as needed by mouth.    . carbidopa-levodopa (SINEMET IR) 25-100 MG per tablet Take 2 tablets by mouth 3 (three) times daily. (Patient taking differently: Take 2 tablets 4 (four) times daily by mouth. ) 180 tablet 12  . FLUoxetine (PROZAC) 40 MG capsule   5  . meloxicam (MOBIC) 7.5 MG tablet Take 7.5 mg daily by mouth.    Marland Kitchen. omeprazole (PRILOSEC) 20 MG capsule Take by mouth.    . oxybutynin (DITROPAN-XL) 10 MG 24 hr tablet Take 10 mg by mouth daily.    . valsartan-hydrochlorothiazide (DIOVAN-HCT) 160-12.5 MG per tablet Take 1 tablet by mouth daily.  0  . vitamin B-12 (CYANOCOBALAMIN) 500 MCG tablet Take 500 mcg by mouth. Takes every other day.    . Vitamin D, Cholecalciferol, 400 units TABS Take by mouth. Takes 2 tablets once daily.,    . ibuprofen (ADVIL,MOTRIN) 200 MG tablet Take 200 mg by mouth every 6 (six) hours as needed.    . Calcium Carbonate Antacid (TUMS E-X PO) Take 1 tablet by mouth daily.    . polyethylene glycol powder (GLYCOLAX/MIRALAX) powder Take 17 g by mouth daily as needed (for constipation).      No facility-administered medications prior to visit.     PAST MEDICAL HISTORY: Past Medical History:  Diagnosis Date  . Anemia   . Anxiety   . Arthritis   . B12 deficiency   . CVA  (cerebral infarction)   . Depressive disorder   . Esophageal reflux   . Hypertension   . Incontinence   . Interstitial cystitis   . Osteomyelitis (HCC)   . Parkinson disease (HCC)   . Renal cyst   . Tremor     PAST SURGICAL HISTORY: Past Surgical History:  Procedure Laterality Date  . LEG SURGERY Right    x6  . TOTAL HIP ARTHROPLASTY    . TOTAL KNEE ARTHROPLASTY    . TOTAL VAGINAL HYSTERECTOMY      FAMILY HISTORY: Family History  Problem Relation Age of Onset  . Cancer Father   . Heart Problems Mother   . Cancer Brother     SOCIAL HISTORY:  Social History   Socioeconomic History  . Marital status: Single    Spouse name: Not on file  . Number of children: 3  .  Years of education: HS  . Highest education level: Not on file  Social Needs  . Financial resource strain: Not on file  . Food insecurity - worry: Not on file  . Food insecurity - inability: Not on file  . Transportation needs - medical: Not on file  . Transportation needs - non-medical: Not on file  Occupational History  . Occupation: Retired  Tobacco Use  . Smoking status: Never Smoker  . Smokeless tobacco: Never Used  Substance and Sexual Activity  . Alcohol use: No  . Drug use: No  . Sexual activity: No  Other Topics Concern  . Not on file  Social History Narrative   Pt lives at assisted living, husband passed away in 11/20/2013.   Caffeine Use: Does not consume   Has someone that comes and helps her with home care    Left-handed     PHYSICAL EXAM  Vitals:   12/21/16 0930  BP: (!) 149/84  Pulse: 85  Weight: 126 lb 3.2 oz (57.2 kg)  Height: 5' (1.524 m)    Not recorded      Body mass index is 24.65 kg/m.  Generalized: Well developed, in no acute distress, well groomed.  Cardiac: Regular rate rhythm, no murmur   Neurological examination  Mental Status: Awake, alert. Language is fluent and comprehension intact. Soft, trembling voice. POSITIVE MYERSON'S SIGN.  Cranial  Nerves: Pupils are equal and reactive to light. Visual fields are full to confrontation. Conjugate eye movements are full and symmetric. Facial sensation and strength are symmetric. Hearing is intact. Palate elevated symmetrically and uvula is midline. Shoulder shug is symmetric. Tongue is midline. SOFT HOARSE VOICE. Motor: LEFT SHOULDER DYSKINESIA / SHRUGGING MOVEMENTS; normal bulk and COGWHEEL RIGIDITY IN RUE > LUE. MILD REST TREMOR IN LUE > RUE; MILD BRADYKINESIA IN BUE AND BLE (RIGHT WORSE THAN LEFT). Full strength in the upper and lower extremities. No pronator drift.  Sensory: Intact and symmetric to light touch, temperature, vibration.  Coordination: MILD DYSMETRIA IN RUE.  Gait and Station: Narrow based gait; SMOOTH STRIDE; STOOPED POSTURE. USES WALKER Reflexes: Deep tendon reflexes in the UPPER EXT ARE TRACE; ABSENT IN LEGS.     DIAGNOSTIC DATA (LABS, IMAGING, TESTING) - I reviewed patient records, labs, notes, testing and imaging myself where available.  Lab Results  Component Value Date   WBC 6.4 10/17/2012   HGB 12.7 10/17/2012   HCT 38.0 10/17/2012   MCV 86.8 10/17/2012   PLT 199 10/17/2012      Component Value Date/Time   NA 137 10/18/2012 0603   K 3.5 10/18/2012 0603   CL 103 10/18/2012 0603   CO2 27 10/18/2012 0603   GLUCOSE 91 10/18/2012 0603   BUN 22 10/18/2012 0603   CREATININE 1.26 (H) 10/18/2012 0603   CALCIUM 8.9 10/18/2012 0603   GFRNONAA 39 (L) 10/18/2012 0603   GFRAA 45 (L) 10/18/2012 0603   No results found for: CHOL No results found for: HGBA1C No results found for: VITAMINB12 Lab Results  Component Value Date   TSH 1.429 10/17/2012    03/24/10 CT head (without contrast) demonstrating: 1. Incidentally noted bilateral ossicular reconstruction procedure.   2. Minimal chronic small vessel ischemic disease. 3. Compared to the prior CT head from 04/09/08, the previously noted right caudate ischemic infarction is no longer clearly seen.   ASSESSMENT  AND PLAN  83 y.o. year old female here with hypertension, depression, with progressive tremor and gait difficulty since March 2010. Exam notable for right  arm resting tremor, cogwheel rigidity, bradykinesia and postural instability. Has 4/4 cardinal signs of Parkinson's disease. Mild response to carb/levo.    Dx:  Parkinson's disease (tremor, stiffness, slow motion, unstable posture) (HCC)  Gait difficulty    PLAN:   I spent 15 minutes of face to face time with patient. Greater than 50% of time was spent in counseling and coordination of care with patient. In summary we discussed:   PARKINSON'S DISEASE - continue carbidopa/levodopa (25/100) 1 tab QID - use walker to prevent falls; consider u-step walker - PT at assisted living  Return if symptoms worsen or fail to improve, for return to PCP.    Suanne MarkerVIKRAM R. PENUMALLI, MD 12/21/2016, 10:06 AM Certified in Neurology, Neurophysiology and Neuroimaging  [facility] [address], [address] [place], [place] [address] [phone]

## 2019-07-14 DEATH — deceased
